# Patient Record
Sex: Male | Born: 1955 | Race: Black or African American | Hispanic: No | Marital: Married | Smoking: Current some day smoker
Health system: Southern US, Community
[De-identification: ages and names within clinical notes are randomized; demographics above are authoritative.]

## PROBLEM LIST (undated history)

## (undated) DIAGNOSIS — E785 Hyperlipidemia, unspecified: Secondary | ICD-10-CM

## (undated) DIAGNOSIS — J45909 Unspecified asthma, uncomplicated: Secondary | ICD-10-CM

## (undated) DIAGNOSIS — H409 Unspecified glaucoma: Secondary | ICD-10-CM

## (undated) DIAGNOSIS — M1990 Unspecified osteoarthritis, unspecified site: Secondary | ICD-10-CM

## (undated) DIAGNOSIS — R972 Elevated prostate specific antigen [PSA]: Secondary | ICD-10-CM

## (undated) DIAGNOSIS — I10 Essential (primary) hypertension: Secondary | ICD-10-CM

## (undated) HISTORY — DX: Elevated prostate specific antigen (PSA): R97.20

## (undated) HISTORY — DX: Unspecified osteoarthritis, unspecified site: M19.90

## (undated) HISTORY — DX: Unspecified glaucoma: H40.9

## (undated) HISTORY — DX: Unspecified asthma, uncomplicated: J45.909

## (undated) HISTORY — DX: Hyperlipidemia, unspecified: E78.5

## (undated) HISTORY — DX: Essential (primary) hypertension: I10

---

## 2009-07-31 ENCOUNTER — Ambulatory Visit: Payer: Self-pay | Admitting: Family Medicine

## 2013-07-02 ENCOUNTER — Ambulatory Visit: Payer: Self-pay | Admitting: Internal Medicine

## 2015-12-29 DIAGNOSIS — D649 Anemia, unspecified: Secondary | ICD-10-CM | POA: Insufficient documentation

## 2016-08-18 DIAGNOSIS — R972 Elevated prostate specific antigen [PSA]: Secondary | ICD-10-CM | POA: Insufficient documentation

## 2016-10-22 ENCOUNTER — Ambulatory Visit (INDEPENDENT_AMBULATORY_CARE_PROVIDER_SITE_OTHER): Payer: Self-pay | Admitting: Urology

## 2016-10-22 ENCOUNTER — Encounter: Payer: Self-pay | Admitting: Urology

## 2016-10-22 VITALS — BP 149/79 | HR 76 | Ht 70.0 in | Wt 223.0 lb

## 2016-10-22 DIAGNOSIS — Z8042 Family history of malignant neoplasm of prostate: Secondary | ICD-10-CM

## 2016-10-22 DIAGNOSIS — N138 Other obstructive and reflux uropathy: Secondary | ICD-10-CM

## 2016-10-22 DIAGNOSIS — R972 Elevated prostate specific antigen [PSA]: Secondary | ICD-10-CM

## 2016-10-22 DIAGNOSIS — N401 Enlarged prostate with lower urinary tract symptoms: Secondary | ICD-10-CM

## 2016-10-22 MED ORDER — TAMSULOSIN HCL 0.4 MG PO CAPS: 0.4000 mg | ORAL_CAPSULE | Freq: Every day | ORAL | 11 refills | Status: DC

## 2016-10-22 NOTE — Progress Notes (Signed)
10/22/2016 3:11 PM   Peter Whitaker 1955/07/06 [id number]  Referring provider: Mick SellFitzgerald, David P, MD [address] [place], [place] [address]  Chief Complaint  Patient presents with  . New Patient (Initial Visit)    Elevated PSA referred by Peter Whitaker    HPI: 61 year old male referred by his PCP for elevated PSA.    His PSA on 12/29/2015 which was 5.53.   This was repeated on 08/18/2016 found to be 5.3 with a free PSA of 0.51, 9.6%. This puts his risk of prostate cancer at 58%.  He denies a previous history of elevated PSA. He's never seen a urologist in the past.  + FH of prostate cancer, father dx in late 2270s but lived until 2390s with prostate cancer.  He was on lupron.   He denies any urine or symptoms at baseline. He feels that he is able to empty his bladder and has good urinary stream. No frequency or urgency. No nocturia. He does however note that when he takes cold medicine, he has difficulty emptying his bladder and urinating. He does have severe allergies and would like to be on a daily Claritin.  He has been told in the past that he has an enlarged prostate gland.   PMH: Past Medical History:  Diagnosis Date  . Arthritis   . Asthma   . Elevated PSA   . Glaucoma   . HLD (hyperlipidemia)   . HTN (hypertension)     Surgical History: History reviewed. No pertinent surgical history.  Home Medications:  Allergies as of 10/22/2016   No Known Allergies     Medication List       Accurate as of 10/22/16  3:11 PM. Always use your most recent med list.          amLODipine 10 MG tablet Commonly known as:  NORVASC Take by mouth.   aspirin EC 81 MG tablet Take by mouth.   hydrochlorothiazide 25 MG tablet Commonly known as:  HYDRODIURIL Take by mouth.   tamsulosin 0.4 MG Caps capsule Commonly known as:  FLOMAX Take 1 capsule (0.4 mg total) by mouth daily.       Allergies: No Known Allergies  Family History: Family History  Problem  Relation Age of Onset  . Prostate cancer Father   . Kidney cancer Neg Hx   . Bladder Cancer Neg Hx     Social History:  reports that he has been smoking Cigars.  His smokeless tobacco use includes Chew. He reports that he drinks alcohol. He reports that he does not use drugs.  ROS: UROLOGY Frequent Urination?: No Hard to postpone urination?: No Burning/pain with urination?: No Get up at night to urinate?: No Leakage of urine?: No Urine stream starts and stops?: Yes Trouble starting stream?: No Do you have to strain to urinate?: No Blood in urine?: No Urinary tract infection?: No Sexually transmitted disease?: No Injury to kidneys or bladder?: No Painful intercourse?: No Weak stream?: No Erection problems?: No Penile pain?: No  Gastrointestinal Nausea?: No Vomiting?: No Indigestion/heartburn?: No Diarrhea?: No Constipation?: No  Constitutional Fever: No Night sweats?: No Weight loss?: No Fatigue?: No  Skin Skin rash/lesions?: No Itching?: Yes  Eyes Blurred vision?: Yes Double vision?: No  Ears/Nose/Throat Sore throat?: Yes Sinus problems?: Yes  Hematologic/Lymphatic Swollen glands?: No Easy bruising?: No  Cardiovascular Leg swelling?: Yes Chest pain?: No  Respiratory Cough?: Yes Shortness of breath?: Yes  Endocrine Excessive thirst?: No  Musculoskeletal Back pain?: Yes Joint pain?:  Yes  Neurological Headaches?: No Dizziness?: Yes  Psychologic Depression?: No Anxiety?: No  Physical Exam: BP (!) 149/79   Pulse 76   Ht 5\' 10"  (1.778 m)   Wt 223 lb (101.2 kg)   BMI 32.00 kg/m   Constitutional:  Alert and oriented, No acute distress.  African-American male. HEENT: [place] AT, moist mucus membranes.  Trachea midline, no masses. Cardiovascular: No clubbing, cyanosis, or edema. Respiratory: Normal respiratory effort, no increased work of breathing. GI: Abdomen is soft, nontender, nondistended, no abdominal masses GU: No CVA tenderness.    Rectal: Increased sphincter tone. Due to habitus, exam was somewhat difficult but possible induration on the left lateral lobe of the gland.  Enlarged 50+ cc. Skin: No rashes, bruises or suspicious lesions. Neurologic: Grossly intact, no focal deficits, moving all 4 extremities. Psychiatric: Normal mood and affect.  Laboratory Data: Creatinine 1.08/2016 PSA as above  Pertinent Imaging: n/a  Assessment & Plan:  61 year old African-American male with elevated PSA of 5.3, elevated free PSA, and family history of prostate cancer. Rectal exam today was somewhat limited.  1. BPH with obstruction/lower urinary tract symptoms Patient does have an enlarged gland on rectal exam today Although he is asymptomatic at baseline, when he takes medications with anticholinergic properties, he has issues voiding, with a stream, and emptying As such, I prescribed him Flomax to be taken when he is taking cold medications He should return if his symptoms persist or fail to improve  2. Elevated PSA  We reviewed the implications of an elevated PSA and the uncertainty surrounding it. In general, a man's PSA increases with age and is produced by both normal and cancerous prostate tissue. Differential for elevated PSA is BPH, prostate cancer, infection, recent intercourse/ejaculation, prostate infarction, recent urethroscopic manipulation (foley placement/cystoscopy) and prostatitis. Management of an elevated PSA can include observation or prostate biopsy and wediscussed this in detail. We discussed that indications for prostate biopsy are defined by age and race specific PSA cutoffs as well as a PSA velocity of 0.75/year.  He does have multiple risk factors for prostate cancer. As such, I recommend a prostate biopsy.  We discussed prostate biopsy in detail including the procedure itself, the risks of blood in the urine, stool, and ejaculate, serious infection, and discomfort. He is willing to proceed with this as  discussed.  He will hold his aspirin for 1 week prior to the procedure. Instructions were reviewed again today with the nurses well.   3. Family history of prostate cancer Risk factor for prostate cancer  Schedule prostate biopsy  Vanna Scotland, MD  [facility] [address] [place], [place] [address] [phone]

## 2016-11-24 ENCOUNTER — Ambulatory Visit (INDEPENDENT_AMBULATORY_CARE_PROVIDER_SITE_OTHER): Payer: Self-pay | Admitting: Urology

## 2016-11-24 ENCOUNTER — Other Ambulatory Visit: Payer: Self-pay | Admitting: Urology

## 2016-11-24 ENCOUNTER — Encounter: Payer: Self-pay | Admitting: Urology

## 2016-11-24 VITALS — BP 126/69 | HR 93 | Ht 70.0 in | Wt 223.0 lb

## 2016-11-24 DIAGNOSIS — R972 Elevated prostate specific antigen [PSA]: Secondary | ICD-10-CM

## 2016-11-24 MED ORDER — GENTAMICIN SULFATE 40 MG/ML IJ SOLN
80.0000 mg | Freq: Once | INTRAMUSCULAR | Status: AC
  Administered 2016-11-24: 80 mg via INTRAMUSCULAR

## 2016-11-24 MED ORDER — LEVOFLOXACIN 500 MG PO TABS
500.0000 mg | ORAL_TABLET | Freq: Once | ORAL | Status: AC
  Administered 2016-11-24: 500 mg via ORAL

## 2016-11-24 NOTE — Progress Notes (Signed)
   11/24/16  CC:  Chief Complaint  Patient presents with  . Prostate Biopsy    HPI: 61 year old male referred by his PCP for elevated PSA who presents today for prostate biopsy.      His PSA on 12/29/2015 which was 5.53.   This was repeated on 08/18/2016 found to be 5.3 with a free PSA of 0.51, 9.6%. This puts his risk of prostate cancer at 58%.  He denies a previous history of elevated PSA. He's never seen a urologist in the past.  + FH of prostate cancer, father dx in late 41s but lived until 71s with prostate cancer.  He was on lupron.  Rectal same was somewhat difficult but possible induration the left lateral lobe  Blood pressure 126/69, pulse 93, height 5\' 10"  (1.778 m), weight 223 lb (101.2 kg). NED. A&Ox3.   No respiratory distress   Abd soft, NT, ND Normal phallus with bilateral descended testicles  Prostate Biopsy Procedure   Informed consent was obtained after discussing risks/benefits of the procedure.  A time out was performed to ensure correct patient identity.  Pre-Procedure: - Gentamicin given prophylactically - Levaquin 500 mg administered PO -Transrectal Ultrasound performed revealing a 51.7 gm prostate -No significant hypoechoic or median lobe noted  Procedure: - Prostate block performed using 10 cc 1% lidocaine and biopsies taken from sextant areas, a total of 12 under ultrasound guidance.  Post-Procedure: - Patient tolerated the procedure well - He was counseled to seek immediate medical attention if experiences any severe pain, significant bleeding, or fevers - Return in one week to discuss biopsy results  Assessment/ Plan:  1. Elevated PSA S/p uncomplicated biopsy Warning symptoms reviewed-particularly signs and symptoms of infection  Follow-up next week as scheduled to review pathology results  Vanna Scotland, MD

## 2016-11-30 ENCOUNTER — Telehealth: Payer: Self-pay

## 2016-11-30 ENCOUNTER — Other Ambulatory Visit: Payer: Self-pay | Admitting: Urology

## 2016-11-30 LAB — PATHOLOGY REPORT

## 2016-11-30 NOTE — Telephone Encounter (Signed)
LMOM

## 2016-11-30 NOTE — Telephone Encounter (Signed)
-----   Message from Vanna Scotland, MD sent at 11/30/2016 11:43 AM EDT ----- [facility], let this patient know his prostate biopsy was negative. I would like to follow him closely because his PSAs more elevated than it like. Please arrange for f/u in 6 months with a PSA just prior to our visit.  Vanna Scotland, MD

## 2016-12-01 NOTE — Telephone Encounter (Signed)
Patient advised of negative biopsy results.  I cancelled his follow up appointment for this week and scheduled a 6 month follow up on 3/1 in [place] with PSA lab the week before.

## 2016-12-03 ENCOUNTER — Ambulatory Visit: Payer: Self-pay | Admitting: Urology

## 2017-05-27 ENCOUNTER — Other Ambulatory Visit
Admission: RE | Admit: 2017-05-27 | Discharge: 2017-05-27 | Disposition: A | Discharge status: Home / Self Care | Payer: BLUE CROSS/BLUE SHIELD | Source: Ambulatory Visit | Attending: Urology | Admitting: Urology

## 2017-05-27 ENCOUNTER — Other Ambulatory Visit: Payer: Self-pay

## 2017-05-27 DIAGNOSIS — R972 Elevated prostate specific antigen [PSA]: Secondary | ICD-10-CM | POA: Diagnosis present

## 2017-05-27 LAB — PSA: Prostatic Specific Antigen: 5.64 ng/mL — ABNORMAL HIGH (ref 0.00–4.00)

## 2017-06-03 ENCOUNTER — Encounter: Payer: Self-pay | Admitting: Urology

## 2017-06-03 ENCOUNTER — Ambulatory Visit (INDEPENDENT_AMBULATORY_CARE_PROVIDER_SITE_OTHER): Payer: BLUE CROSS/BLUE SHIELD | Admitting: Urology

## 2017-06-03 VITALS — BP 156/71 | HR 84 | Ht 70.0 in | Wt 220.0 lb

## 2017-06-03 DIAGNOSIS — N401 Enlarged prostate with lower urinary tract symptoms: Secondary | ICD-10-CM | POA: Diagnosis not present

## 2017-06-03 DIAGNOSIS — R972 Elevated prostate specific antigen [PSA]: Secondary | ICD-10-CM

## 2017-06-03 DIAGNOSIS — N138 Other obstructive and reflux uropathy: Secondary | ICD-10-CM | POA: Diagnosis not present

## 2017-06-03 DIAGNOSIS — Z8042 Family history of malignant neoplasm of prostate: Secondary | ICD-10-CM | POA: Diagnosis not present

## 2017-06-03 NOTE — Progress Notes (Signed)
06/03/2017 12:11 PM   Peter Whitaker [birth year] [id number]  Referring provider: Mick Sell, MD [address] [place], [place] [address]  Chief Complaint  Patient presents with  . Benign Prostatic Hypertrophy    HPI: 62 year old male with a history of elevated PSA who returns today for routine follow-up.    He returns today for PSA following negative prostate biopsy 6 months ago (11/24/16).   TRUS volume 52 cc.  Rectal exam on 10/22/16 enlarged, 50+ cc with possible induration at the left lateral lobe but exam limited by habitus.  + FH of prostate cancer, father dx in late 25s but lived until 72s with prostate cancer.  He was on lupron.   He denies any urine or symptoms at baseline. He feels that he is able to empty his bladder and has good urinary stream. No frequency or urgency. No nocturia.  Stable on Flomax.  PSA trend: 5.53 12/29/2015 5.3  (free PSA 0.51) 08/18/2016  5.64 05/27/17  PMH: Past Medical History:  Diagnosis Date  . Arthritis   . Asthma   . Elevated PSA   . Glaucoma   . HLD (hyperlipidemia)   . HTN (hypertension)     Surgical History: History reviewed. No pertinent surgical history.  Home Medications:  Allergies as of 06/03/2017   No Known Allergies     Medication List        Accurate as of 06/03/17 11:59 PM. Always use your most recent med list.          amLODipine 10 MG tablet Commonly known as:  NORVASC Take by mouth.   hydrochlorothiazide 25 MG tablet Commonly known as:  HYDRODIURIL Take by mouth.   tamsulosin 0.4 MG Caps capsule Commonly known as:  FLOMAX Take 1 capsule (0.4 mg total) by mouth daily.       Allergies: No Known Allergies  Family History: Family History  Problem Relation Age of Onset  . Prostate cancer Father   . Kidney cancer Neg Hx   . Bladder Cancer Neg Hx     Social History:  reports that he has been smoking cigars.  His smokeless tobacco use includes chew. He reports that he drinks  alcohol. He reports that he does not use drugs.  ROS: UROLOGY Frequent Urination?: No Hard to postpone urination?: No Burning/pain with urination?: No Get up at night to urinate?: No Leakage of urine?: No Urine stream starts and stops?: No Trouble starting stream?: No Do you have to strain to urinate?: No Blood in urine?: No Urinary tract infection?: No Sexually transmitted disease?: No Injury to kidneys or bladder?: No Painful intercourse?: No Weak stream?: No Erection problems?: No Penile pain?: No  Gastrointestinal Nausea?: No Vomiting?: No Indigestion/heartburn?: No Diarrhea?: No Constipation?: No  Constitutional Fever: No Night sweats?: No Weight loss?: No Fatigue?: No  Skin Skin rash/lesions?: No Itching?: No  Eyes Blurred vision?: No Double vision?: No  Ears/Nose/Throat Sore throat?: No Sinus problems?: No  Hematologic/Lymphatic Swollen glands?: No Easy bruising?: No  Cardiovascular Leg swelling?: No Chest pain?: No  Respiratory Cough?: No Shortness of breath?: No  Endocrine Excessive thirst?: No  Musculoskeletal Back pain?: No Joint pain?: No  Neurological Headaches?: No Dizziness?: No  Psychologic Depression?: No Anxiety?: No  Physical Exam: BP (!) 156/71   Pulse 84   Ht 5\' 10"  (1.778 m)   Wt 220 lb (99.8 kg)   BMI 31.57 kg/m   Constitutional:  Alert and oriented, No acute distress.  African-American male. HEENT:  [place] AT, moist mucus membranes.  Trachea midline, no masses. Cardiovascular: No clubbing, cyanosis, or edema. Respiratory: Normal respiratory effort, no increased work of breathing. Skin: No rashes, bruises or suspicious lesions. Neurologic: Grossly intact, no focal deficits, moving all 4 extremities. Psychiatric: Normal mood and affect.  Laboratory Data: Creatinine 1.0 pn 03/08/2017 PSA as above  Pertinent Imaging: n/a  Assessment & Plan:   1. BPH with obstruction/lower urinary tract symptoms  Continue  flomax Symptoms stable  2. Elevated PSA Elevated PSA  s/p negative biopsy 6 months ago PSA mildly elevated but not significantly changed from previous values Will continue to follow    3. Family history of prostate cancer Risk factor for prostate cancer  Return in about 6 months (around 12/04/2017) for PSA/ DRE.  Vanna ScotlandAshley Aleighya Mcanelly, MD  [facility] [address] [place], [place] [address] [phone]

## 2017-12-09 ENCOUNTER — Other Ambulatory Visit
Admission: RE | Admit: 2017-12-09 | Discharge: 2017-12-09 | Disposition: A | Discharge status: Home / Self Care | Payer: BLUE CROSS/BLUE SHIELD | Source: Ambulatory Visit | Attending: Urology | Admitting: Urology

## 2017-12-09 ENCOUNTER — Ambulatory Visit (INDEPENDENT_AMBULATORY_CARE_PROVIDER_SITE_OTHER): Payer: BLUE CROSS/BLUE SHIELD | Admitting: Urology

## 2017-12-09 ENCOUNTER — Encounter: Payer: Self-pay | Admitting: Urology

## 2017-12-09 ENCOUNTER — Other Ambulatory Visit: Payer: Self-pay | Admitting: Family Medicine

## 2017-12-09 VITALS — BP 153/74 | HR 77 | Ht 70.0 in | Wt 215.0 lb

## 2017-12-09 DIAGNOSIS — N138 Other obstructive and reflux uropathy: Secondary | ICD-10-CM | POA: Diagnosis not present

## 2017-12-09 DIAGNOSIS — R972 Elevated prostate specific antigen [PSA]: Secondary | ICD-10-CM | POA: Diagnosis present

## 2017-12-09 DIAGNOSIS — N401 Enlarged prostate with lower urinary tract symptoms: Secondary | ICD-10-CM

## 2017-12-09 LAB — PSA: PROSTATIC SPECIFIC ANTIGEN: 6.04 ng/mL — AB (ref 0.00–4.00)

## 2017-12-09 NOTE — Progress Notes (Signed)
12/09/2017 2:15 PM   Peter Whitaker [birth year] [id number]  Referring provider: Mick Sell, MD [address] [facility] [place], [place] [address]  Chief Complaint  Patient presents with  . Elevated PSA    HPI: 62 year old male who returns today.  He has a personal history of elevated PSA.  He was last seen in 06/2017 returns for six-month follow-up.  He returns today for PSA following negative prostate biopsy one year ago (11/24/16).   TRUS volume 52 cc.  Rectal exam on 10/22/16 enlarged, 50+ cc with possible induration at the left lateral lobe but exam limited by habitus.  + FH of prostate cancer, father dx in late 37s but lived until 70s with prostate cancer.  He was on lupron.   He denies any urine or symptoms at baseline. He feels that he is able to empty his bladder and has good urinary stream. No frequency or urgency. No nocturia.  Stable on Flomax.  This is unchanged from last visit.  PSA trend: 5.53 12/29/2015 5.3  (free PSA 0.51) 08/18/2016  5.64 05/27/17 PSA today pending  PMH: Past Medical History:  Diagnosis Date  . Arthritis   . Asthma   . Elevated PSA   . Glaucoma   . HLD (hyperlipidemia)   . HTN (hypertension)     Surgical History: History reviewed. No pertinent surgical history.  Home Medications:  Allergies as of 12/09/2017   No Known Allergies     Medication List        Accurate as of 12/09/17  2:15 PM. Always use your most recent med list.          amLODipine 10 MG tablet Commonly known as:  NORVASC Take by mouth.   hydrochlorothiazide 25 MG tablet Commonly known as:  HYDRODIURIL Take by mouth.   potassium chloride SA 20 MEQ tablet Commonly known as:  K-DUR,KLOR-CON Take by mouth.   tamsulosin 0.4 MG Caps capsule Commonly known as:  FLOMAX Take 1 capsule (0.4 mg total) by mouth daily.       Allergies: No Known Allergies  Family History: Family History  Problem Relation Age of  Onset  . Prostate cancer Father   . Kidney cancer Neg Hx   . Bladder Cancer Neg Hx     Social History:  reports that he has been smoking cigars. His smokeless tobacco use includes chew. He reports that he drinks alcohol. He reports that he does not use drugs.  ROS: UROLOGY Frequent Urination?: No Hard to postpone urination?: No Burning/pain with urination?: No Get up at night to urinate?: No Leakage of urine?: No Urine stream starts and stops?: No Trouble starting stream?: No Do you have to strain to urinate?: No Blood in urine?: No Urinary tract infection?: No Sexually transmitted disease?: No Injury to kidneys or bladder?: No Painful intercourse?: No Weak stream?: No Erection problems?: No Penile pain?: No  Gastrointestinal Nausea?: No Vomiting?: No Indigestion/heartburn?: No Diarrhea?: No Constipation?: No  Constitutional Fever: No Night sweats?: No Weight loss?: No Fatigue?: No  Skin Skin rash/lesions?: No Itching?: Yes  Eyes Blurred vision?: Yes Double vision?: No  Ears/Nose/Throat Sore throat?: Yes Sinus problems?: Yes  Hematologic/Lymphatic Swollen glands?: No Easy bruising?: No  Cardiovascular Leg swelling?: No Chest pain?: No  Respiratory Cough?: No Shortness of breath?: No  Endocrine Excessive thirst?: No  Musculoskeletal Back pain?: Yes Joint pain?: No  Neurological Headaches?: No Dizziness?: No  Psychologic Depression?: No Anxiety?: No  Physical Exam:  BP (!) 153/74 (BP Location: Left Arm, Patient Position: Sitting, Cuff Size: Normal)   Pulse 77   Ht 5\' 10"  (1.778 m)   Wt 215 lb (97.5 kg)   BMI 30.85 kg/m   Constitutional:  Alert and oriented, No acute distress. HEENT: [place] AT, moist mucus membranes.  Trachea midline, no masses. Cardiovascular: No clubbing, cyanosis, or edema. Respiratory: Normal respiratory effort, no increased work of breathing. GU: No CVA tenderness Sphincter tone: Normal sphincter tone.  50+ cc  prostate, somewhat limited due to habitus, no obvious palpable nodules. Skin: No rashes, bruises or suspicious lesions. Neurologic: Grossly intact, no focal deficits, moving all 4 extremities. Psychiatric: Normal mood and affect.  Laboratory Data: PSA trend as above  Urinalysis N/a  Pertinent Imaging: n/a  Assessment & Plan:    1. Elevated PSA Personal history of relatively stably elevated PSA PSA today is pending Depending on his repeat labs today, we will either recommend continued six-month follow-up, increasing follow-up to q. 12 months versus prostate MRI if PSA continues to rise He is agreeable with this plan we will give him a call on Monday with the results and recommendations He was advised in the future, he should have his PSA drawn the day prior to the visit if possible  2. BPH with obstruction/lower urinary tract symptoms Doing well on Flomax, continue this medication   Return in about 6 months (around 06/09/2018) for PSA (prior) .  Vanna Scotland, MD  [facility] [address] [place], [place] [address] [phone]

## 2018-03-27 ENCOUNTER — Telehealth: Payer: Self-pay

## 2018-03-27 NOTE — Telephone Encounter (Signed)
-----   Message from Vanna ScotlandAshley Brandon, MD sent at 03/24/2018  3:31 PM EST ----- This is an old result from 3 months ago still in my inbasket.  I wanted to be sure pt was aware that is PSA continues to trend up and we will plan to recheck in march as previously scheduled.  If still up or rising in march, will pursue MRI.  Basically no change in current plan -- will continue to follow closely for now and reassess.    Vanna ScotlandAshley Brandon, MD

## 2018-03-27 NOTE — Telephone Encounter (Signed)
Patient called back agreed with plan      Peter Whitaker

## 2018-03-27 NOTE — Telephone Encounter (Signed)
Left pt mess to call 

## 2018-06-09 ENCOUNTER — Telehealth: Payer: Self-pay | Admitting: Urology

## 2018-06-09 ENCOUNTER — Ambulatory Visit: Payer: BLUE CROSS/BLUE SHIELD | Admitting: Urology

## 2018-06-09 ENCOUNTER — Encounter: Payer: Self-pay | Admitting: Urology

## 2018-06-09 NOTE — Progress Notes (Deleted)
06/09/18   This patient was a no-show today.  It is very important that he follow-up with PSA.  Please call him to encourage to reschedule.   Parth Mccormac, MD 

## 2018-06-09 NOTE — Telephone Encounter (Signed)
Tried to contact pt and phone went dead, no v/m.

## 2018-06-09 NOTE — Telephone Encounter (Signed)
Please mail a letter to the patient indicating that follow-up in the near future with PSA is imperative.   Vanna Scotland, MD

## 2018-06-09 NOTE — Telephone Encounter (Signed)
06/09/18   This patient was a no-show today.  It is very important that he follow-up with PSA.  Please call him to encourage to reschedule.   Vanna Scotland, MD

## 2018-06-12 ENCOUNTER — Encounter: Payer: Self-pay | Admitting: Urology

## 2018-06-12 ENCOUNTER — Telehealth: Payer: Self-pay | Admitting: Urology

## 2018-06-12 NOTE — Telephone Encounter (Signed)
Can you see if he is willing to get PSA blood work at minimum in the interim?  Vanna Scotland, MD

## 2018-06-12 NOTE — Telephone Encounter (Signed)
Left message on pt's home answering machine and mailed letter.

## 2018-06-12 NOTE — Telephone Encounter (Signed)
Pt returned call and I rescheduled his appt for 8/14 in [place] due to his current insurance not being covered by [facility].  He will qualify for Medicare in July and wanted to schedule appt in August.  He will come in for PSA on 8/13 in [place].

## 2018-11-15 ENCOUNTER — Other Ambulatory Visit
Admission: RE | Admit: 2018-11-15 | Discharge: 2018-11-15 | Disposition: A | Discharge status: Home / Self Care | Payer: Medicare Other | Attending: Urology | Admitting: Urology

## 2018-11-15 ENCOUNTER — Other Ambulatory Visit: Payer: Self-pay

## 2018-11-15 DIAGNOSIS — R972 Elevated prostate specific antigen [PSA]: Secondary | ICD-10-CM

## 2018-11-15 LAB — PSA: Prostatic Specific Antigen: 5.36 ng/mL — ABNORMAL HIGH (ref 0.00–4.00)

## 2018-11-17 ENCOUNTER — Other Ambulatory Visit: Payer: Self-pay

## 2018-11-17 ENCOUNTER — Ambulatory Visit (INDEPENDENT_AMBULATORY_CARE_PROVIDER_SITE_OTHER): Payer: Medicare Other | Admitting: Urology

## 2018-11-17 ENCOUNTER — Ambulatory Visit: Payer: BLUE CROSS/BLUE SHIELD | Admitting: Urology

## 2018-11-17 ENCOUNTER — Encounter: Payer: Self-pay | Admitting: Urology

## 2018-11-17 VITALS — BP 148/71 | HR 83 | Ht 70.0 in | Wt 215.0 lb

## 2018-11-17 DIAGNOSIS — E785 Hyperlipidemia, unspecified: Secondary | ICD-10-CM | POA: Insufficient documentation

## 2018-11-17 DIAGNOSIS — I10 Essential (primary) hypertension: Secondary | ICD-10-CM | POA: Insufficient documentation

## 2018-11-17 DIAGNOSIS — M519 Unspecified thoracic, thoracolumbar and lumbosacral intervertebral disc disorder: Secondary | ICD-10-CM | POA: Insufficient documentation

## 2018-11-17 DIAGNOSIS — N400 Enlarged prostate without lower urinary tract symptoms: Secondary | ICD-10-CM | POA: Insufficient documentation

## 2018-11-17 DIAGNOSIS — N401 Enlarged prostate with lower urinary tract symptoms: Secondary | ICD-10-CM

## 2018-11-17 DIAGNOSIS — N138 Other obstructive and reflux uropathy: Secondary | ICD-10-CM

## 2018-11-17 DIAGNOSIS — R972 Elevated prostate specific antigen [PSA]: Secondary | ICD-10-CM

## 2018-11-17 NOTE — Progress Notes (Signed)
11/17/2018 3:01 PM   Peter Whitaker [birth year] [id number]  Referring provider: Gracelyn NurseJohnston, John D, MD [address] [place],  [place] [id number]  Chief Complaint  Patient presents with   Elevated PSA    48month    HPI: 63 year old male who returns today.  He has a personal history of elevated PSA.  He was last seen 6 months ago.  He returns today for PSA following negative prostate biopsy one year ago(11/24/16).   TRUS volume 52 cc.  Rectal exam on 10/22/16 enlarged, 50+ cc with possible induration at the left lateral lobe but exam limited by habitus.  + FH of prostate cancer, father dx in late 3170s but lived until 4490s with prostate cancer. He was on lupron.  He denies any urine or symptoms at baseline. He feels that he is able to empty his bladder and has good urinary stream. No frequency or urgency. No nocturia.Stable on Flomax.  This is unchanged from last visit.  He feels like he is doing well.    PSA trend: 5.53 12/29/2015 5.3(free PSA 0.51)08/18/2016 5.64 05/27/17 6.04  12/09/2017 5.36  11/15/2018  PMH: Past Medical History:  Diagnosis Date   Arthritis    Asthma    Elevated PSA    Glaucoma    HLD (hyperlipidemia)    HTN (hypertension)     Surgical History: No past surgical history on file.  Home Medications:  Allergies as of 11/17/2018   No Known Allergies     Medication List       Accurate as of November 17, 2018  3:01 PM. If you have any questions, ask your nurse or doctor.        amLODipine 10 MG tablet Commonly known as: NORVASC Take by mouth.   hydrochlorothiazide 25 MG tablet Commonly known as: HYDRODIURIL Take by mouth.   potassium chloride SA 20 MEQ tablet Commonly known as: K-DUR Take by mouth.   tamsulosin 0.4 MG Caps capsule Commonly known as: Flomax Take 1 capsule (0.4 mg total) by mouth daily.       Allergies: No Known Allergies  Family History: Family History  Problem Relation Age of Onset    Prostate cancer Father    Kidney cancer Neg Hx    Bladder Cancer Neg Hx     Social History:  reports that he has been smoking cigars. His smokeless tobacco use includes chew. He reports current alcohol use. He reports that he does not use drugs.  ROS: UROLOGY Frequent Urination?: No Hard to postpone urination?: No Burning/pain with urination?: No Get up at night to urinate?: No Leakage of urine?: No Urine stream starts and stops?: No Trouble starting stream?: No Do you have to strain to urinate?: No Blood in urine?: No Urinary tract infection?: No Sexually transmitted disease?: No Injury to kidneys or bladder?: No Painful intercourse?: No Weak stream?: No Erection problems?: No Penile pain?: No  Gastrointestinal Nausea?: No Vomiting?: No Indigestion/heartburn?: No Diarrhea?: No Constipation?: No  Constitutional Fever: No Night sweats?: No Weight loss?: No Fatigue?: No  Skin Skin rash/lesions?: No Itching?: No  Eyes Blurred vision?: No Double vision?: No  Ears/Nose/Throat Sore throat?: No Sinus problems?: No  Hematologic/Lymphatic Swollen glands?: No Easy bruising?: No  Cardiovascular Leg swelling?: No Chest pain?: No  Respiratory Cough?: No Shortness of breath?: No  Endocrine Excessive thirst?: No  Musculoskeletal Back pain?: No Joint pain?: No  Neurological Headaches?: No Dizziness?: No  Psychologic Depression?: No Anxiety?: No  Physical Exam: BP (!) 148/71  Pulse 83    Ht 5\' 10"  (1.778 m)    Wt 215 lb (97.5 kg)    BMI 30.85 kg/m   Constitutional:  Alert and oriented, No acute distress. HEENT: [place] AT, moist mucus membranes.  Trachea midline, no masses. Cardiovascular: No clubbing, cyanosis, or edema. Respiratory: Normal respiratory effort, no increased work of breathing. Rectal: Normal sphincter tone.  50 cc prostate, nontender, no nodules, prominent lateral ridges without significant induration or nodules. Skin: No rashes,  bruises or suspicious lesions. Neurologic: Grossly intact, no focal deficits, moving all 4 extremities. Psychiatric: Normal mood and affect.   Assessment & Plan:     No follow-ups on file.  Hollice Espy, MD  [facility] [address] [place], [place] [address] [phone]

## 2018-12-01 ENCOUNTER — Ambulatory Visit: Payer: BLUE CROSS/BLUE SHIELD | Admitting: Urology

## 2019-08-23 ENCOUNTER — Ambulatory Visit
Admission: EM | Admit: 2019-08-23 | Discharge: 2019-08-23 | Disposition: A | Discharge status: Home / Self Care | Payer: Medicare Other | Attending: Family Medicine | Admitting: Family Medicine

## 2019-08-23 ENCOUNTER — Other Ambulatory Visit: Payer: Self-pay

## 2019-08-23 DIAGNOSIS — R0982 Postnasal drip: Secondary | ICD-10-CM | POA: Insufficient documentation

## 2019-08-23 LAB — GROUP A STREP BY PCR: Group A Strep by PCR: NOT DETECTED

## 2019-08-23 MED ORDER — CETIRIZINE HCL 10 MG PO TABS: 10.0000 mg | ORAL_TABLET | Freq: Every day | ORAL | 1 refills | Status: DC

## 2019-08-23 MED ORDER — IPRATROPIUM BROMIDE 0.06 % NA SOLN: 2.0000 | Freq: Four times a day (QID) | NASAL | 0 refills | Status: DC | PRN

## 2019-08-23 NOTE — Discharge Instructions (Addendum)
This is allergic in nature.  Medications as prescribed.  Take care  Dr. Adriana Simas

## 2019-08-23 NOTE — ED Triage Notes (Addendum)
Sore throat and nasal drainage since Sunday, no fevers at home. Pt states started after getting a haircut. Offered COVID test, patient declined until speaking with provider

## 2019-08-23 NOTE — ED Provider Notes (Signed)
[facility]    CSN: [id number] Arrival date & time: 08/23/19  0093      History   Chief Complaint Chief Complaint  Patient presents with  . Sore Throat   HPI  64 year old male presents with the above complaint.  Patient states that he has had a sore throat and postnasal drip since Sunday.  No fever.  He states that he otherwise feels well.  Declines Covid testing.  No reported sick contacts.  No relieving factors.  States that he has had some pain with swallowing.  No other respiratory symptoms.  No other complaints or concerns at this time.  Past Medical History:  Diagnosis Date  . Arthritis   . Asthma   . Elevated PSA   . Glaucoma   . HLD (hyperlipidemia)   . HTN (hypertension)     Patient Active Problem List   Diagnosis Date Noted  . Benign prostatic hyperplasia 11/17/2018  . Hyperlipidemia 11/17/2018  . Hypertension 11/17/2018  . Lumbar disc disease 11/17/2018  . Elevated PSA 08/18/2016  . Anemia 12/29/2015    Home Medications    Prior to Admission medications   Medication Sig Start Date End Date Taking? Authorizing Provider  fluticasone (FLONASE) 50 MCG/ACT nasal spray Place into both nostrils daily.   Yes [provider]  pseudoephedrine (SUDAFED) 30 MG tablet Take 30 mg by mouth every 4 (four) hours as needed for congestion.   Yes [provider]  amLODipine (NORVASC) 10 MG tablet Take by mouth. 12/11/15   [provider]  cetirizine (ZYRTEC ALLERGY) 10 MG tablet Take 1 tablet (10 mg total) by mouth daily. 08/23/19   Coral Spikes, DO  hydrochlorothiazide (HYDRODIURIL) 25 MG tablet Take by mouth. 12/29/15   [provider]  ipratropium (ATROVENT) 0.06 % nasal spray Place 2 sprays into both nostrils 4 (four) times daily as needed for rhinitis. 08/23/19   Coral Spikes, DO  potassium chloride SA (K-DUR,KLOR-CON) 20 MEQ tablet Take by mouth. 09/07/17 09/07/18  [provider]  tamsulosin (FLOMAX) 0.4 MG CAPS  capsule Take 1 capsule (0.4 mg total) by mouth daily. 10/22/16   Hollice Espy, MD    Family History Family History  Problem Relation Age of Onset  . Prostate cancer Father   . Kidney cancer Neg Hx   . Bladder Cancer Neg Hx     Social History Social History   Tobacco Use  . Smoking status: Current Some Day Smoker    Types: Cigars  . Smokeless tobacco: Current User    Types: Chew  Substance Use Topics  . Alcohol use: Yes  . Drug use: No     Allergies   Patient has no known allergies.   Review of Systems Review of Systems  Constitutional: Negative for fever.  HENT: Positive for postnasal drip and sore throat.    Physical Exam Triage Vital Signs ED Triage Vitals  Enc Vitals Group     BP 08/23/19 0838 (!) 158/78     Pulse Rate 08/23/19 0838 75     Resp 08/23/19 0838 16     Temp 08/23/19 0838 98.2 F (36.8 C)     Temp src --      SpO2 08/23/19 0838 100 %     Weight --      Height --      Head Circumference --      Peak Flow --      Pain Score 08/23/19 0839 0     Pain  Loc --      Pain Edu? --      Excl. in [place]? --    Updated Vital Signs BP (!) 158/78   Pulse 75   Temp 98.2 F (36.8 C)   Resp 16   SpO2 100%   Visual Acuity Right Eye Distance:   Left Eye Distance:   Bilateral Distance:    Right Eye Near:   Left Eye Near:    Bilateral Near:     Physical Exam Vitals and nursing note reviewed.  Constitutional:      General: He is not in acute distress.    Appearance: Normal appearance. He is not ill-appearing.  HENT:     Head: Normocephalic and atraumatic.     Mouth/Throat:     Pharynx: No oropharyngeal exudate.  Eyes:     General:        Right eye: No discharge.        Left eye: No discharge.     Conjunctiva/sclera: Conjunctivae normal.  Cardiovascular:     Rate and Rhythm: Normal rate and regular rhythm.  Pulmonary:     Effort: Pulmonary effort is normal.     Breath sounds: Normal breath sounds. No wheezing, rhonchi or rales.    Neurological:     Mental Status: He is alert.  Psychiatric:        Mood and Affect: Mood normal.        Behavior: Behavior normal.    UC Treatments / Results  Labs (all labs ordered are listed, but only abnormal results are displayed) Labs Reviewed  GROUP A STREP BY PCR    EKG   Radiology No results found.  Procedures Procedures (including critical care time)  Medications Ordered in UC Medications - No data to display  Initial Impression / Assessment and Plan / UC Course  I have reviewed the triage vital signs and the nursing notes.  Pertinent labs & imaging results that were available during my care of the patient were reviewed by me and considered in my medical decision making (see chart for details).    64 year old year old male presents with sore throat and post-nasal drip. Treating with Atrovent and Zyrtec. Strep negative.   Final Clinical Impressions(s) / UC Diagnoses   Final diagnoses:  Post-nasal drip     Discharge Instructions     This is allergic in nature.  Medications as prescribed.  Take care  Dr. Adriana Simas    ED Prescriptions    Medication Sig Dispense Auth. Provider   cetirizine (ZYRTEC ALLERGY) 10 MG tablet Take 1 tablet (10 mg total) by mouth daily. 30 tablet Veron Senner G, DO   ipratropium (ATROVENT) 0.06 % nasal spray Place 2 sprays into both nostrils 4 (four) times daily as needed for rhinitis. 15 mL Tommie Sams, DO     PDMP not reviewed this encounter.   Tommie Sams, [place] 08/23/19 1141

## 2019-11-19 ENCOUNTER — Other Ambulatory Visit
Admission: RE | Admit: 2019-11-19 | Discharge: 2019-11-19 | Disposition: A | Discharge status: Home / Self Care | Payer: Medicare Other | Attending: Urology | Admitting: Urology

## 2019-11-19 ENCOUNTER — Other Ambulatory Visit: Payer: Self-pay

## 2019-11-19 DIAGNOSIS — R972 Elevated prostate specific antigen [PSA]: Secondary | ICD-10-CM | POA: Insufficient documentation

## 2019-11-19 LAB — PSA: Prostatic Specific Antigen: 5.15 ng/mL — ABNORMAL HIGH (ref 0.00–4.00)

## 2019-11-22 NOTE — Progress Notes (Signed)
11/23/2019  3:59 PM   Peter Whitaker 10-05-1955 [id number]  Referring provider: Gracelyn Nurse, MD [address] [place],  [place] [address] Chief Complaint  Patient presents with  . Follow-up    PSA    HPI: Peter Whitaker is a 64 y.o. male presents for a 1 year follow up with a history of elevated PSA.   Prostate biopsy was negative on 11/24/2016.   TRUS volume 52 cc.  Rectal exam on 10/22/16 enlarged, 50+ cc with possible induration at the left lateral lobe but exam limited by habitus.  + FH of prostate cancer, father dx in late 84s but lived until 29s with prostate cancer. He was on lupron.  Most recent PSA was 5.15 as of 11/19/2019.   Today he is doing well. He is fully COVID-19 vaccinated.   Denies hematuria, nocturia, or any urinary symptoms. He reports mid section back pain.   PSA trend: 5.53 12/29/2015 5.3(free PSA 0.51)08/18/2016 5.64 05/27/17 6.04  12/09/2017 5.36  11/15/2018 5.15  11/19/2019   PMH: Past Medical History:  Diagnosis Date  . Arthritis   . Asthma   . Elevated PSA   . Glaucoma   . HLD (hyperlipidemia)   . HTN (hypertension)     Surgical History: No past surgical history on file.  Home Medications:  Allergies as of 11/23/2019   No Known Allergies     Medication List       Accurate as of November 23, 2019 11:59 PM. If you have any questions, ask your nurse or doctor.        amLODipine 10 MG tablet Commonly known as: NORVASC Take by mouth.   cetirizine 10 MG tablet Commonly known as: ZyrTEC Allergy Take 1 tablet (10 mg total) by mouth daily.   fluticasone 50 MCG/ACT nasal spray Commonly known as: FLONASE Place into both nostrils daily.   hydrochlorothiazide 25 MG tablet Commonly known as: HYDRODIURIL Take by mouth.   ipratropium 0.06 % nasal spray Commonly known as: ATROVENT Place 2 sprays into both nostrils 4 (four) times daily as needed for rhinitis.   potassium chloride SA 20 MEQ tablet Commonly  known as: KLOR-CON Take by mouth.   pseudoephedrine 30 MG tablet Commonly known as: SUDAFED Take 30 mg by mouth every 4 (four) hours as needed for congestion.   tamsulosin 0.4 MG Caps capsule Commonly known as: Flomax Take 1 capsule (0.4 mg total) by mouth daily.       Allergies: No Known Allergies  Family History: Family History  Problem Relation Age of Onset  . Prostate cancer Father   . Kidney cancer Neg Hx   . Bladder Cancer Neg Hx     Social History:  reports that he has been smoking cigars. His smokeless tobacco use includes chew. He reports current alcohol use. He reports that he does not use drugs.   Physical Exam: BP (!) 163/79   Pulse 91   Ht 5\' 10"  (1.778 m)   Wt 215 lb (97.5 kg)   BMI 30.85 kg/m   Constitutional:  Alert and oriented, No acute distress. HEENT: [place] AT, moist mucus membranes.  Trachea midline, no masses. Cardiovascular: No clubbing, cyanosis, or edema. Respiratory: Normal respiratory effort, no increased work of breathing. Rectal: Normal sphincter tone, prostatomegaly, no nodules/tenderness Skin: No rashes, bruises or suspicious lesions. Neurologic: Grossly intact, no focal deficits, moving all 4 extremities. Psychiatric: Normal mood and affect.  Assessment & Plan:    1. History of elevated PSA PSA  is stable and reassuring. DRE is normal Will continue annual follow up.    Follow up in 1 year with PSA and DRE.  [facility] [address] [place], [place] [address] [phone]  I, Peter Whitaker, am acting as a scribe for Dr. Vanna Scotland.  I have reviewed the above documentation for accuracy and completeness, and I agree with the above.   Vanna Scotland, MD

## 2019-11-23 ENCOUNTER — Ambulatory Visit: Payer: Medicare Other | Admitting: Urology

## 2019-11-23 ENCOUNTER — Encounter: Payer: Self-pay | Admitting: Urology

## 2019-11-23 ENCOUNTER — Other Ambulatory Visit: Payer: Self-pay

## 2019-11-23 VITALS — BP 163/79 | HR 91 | Ht 70.0 in | Wt 215.0 lb

## 2019-11-23 DIAGNOSIS — R972 Elevated prostate specific antigen [PSA]: Secondary | ICD-10-CM | POA: Diagnosis not present

## 2020-10-01 LAB — COLOGUARD: COLOGUARD: NEGATIVE

## 2020-11-12 ENCOUNTER — Other Ambulatory Visit: Payer: Self-pay

## 2020-11-12 ENCOUNTER — Other Ambulatory Visit
Admission: RE | Admit: 2020-11-12 | Discharge: 2020-11-12 | Disposition: A | Discharge status: Home / Self Care | Payer: Medicare Other | Attending: Urology | Admitting: Urology

## 2020-11-12 DIAGNOSIS — R972 Elevated prostate specific antigen [PSA]: Secondary | ICD-10-CM

## 2020-11-12 LAB — PSA: Prostatic Specific Antigen: 6.8 ng/mL — ABNORMAL HIGH (ref 0.00–4.00)

## 2020-11-21 ENCOUNTER — Other Ambulatory Visit: Payer: Self-pay

## 2020-11-21 ENCOUNTER — Ambulatory Visit (INDEPENDENT_AMBULATORY_CARE_PROVIDER_SITE_OTHER): Payer: Medicare Other | Admitting: Urology

## 2020-11-21 ENCOUNTER — Encounter: Payer: Self-pay | Admitting: Urology

## 2020-11-21 VITALS — BP 166/65 | HR 90 | Ht 70.0 in | Wt 212.0 lb

## 2020-11-21 DIAGNOSIS — R972 Elevated prostate specific antigen [PSA]: Secondary | ICD-10-CM | POA: Diagnosis not present

## 2020-11-21 NOTE — Progress Notes (Signed)
11/21/2020 9:14 AM   Maurine Minister Braman 1955/10/07 [id number]  Referring provider: Mick Sell, MD [address] [place],  [place] [address]  Chief Complaint  Patient presents with   Elevated PSA    1 year follow up    HPI: 65 year old male with a personal history of elevated PSA and BPH who returns today for routine annual follow-up.  No urinary complaints today.  No changes in medial history.    + FH of prostate cancer, father dx in late 89s but lived until 55s with prostate cancer.  He was on lupron.  PSA trend: 5.53 12/29/2015 5.3  (free PSA 0.51) 08/18/2016   --> prostate biopsy negative/ TRUS vol 52 5.64 05/27/17 6.04  12/09/2017 5.36  11/15/2018 5.15  11/19/2019  6.80  11/12/2020   PMH: Past Medical History:  Diagnosis Date   Arthritis    Asthma    Elevated PSA    Glaucoma    HLD (hyperlipidemia)    HTN (hypertension)     Surgical History: No past surgical history on file.  Home Medications:  Allergies as of 11/21/2020   No Known Allergies      Medication List        Accurate as of November 21, 2020  9:14 AM. If you have any questions, ask your nurse or doctor.          STOP taking these medications    fluticasone 50 MCG/ACT nasal spray Commonly known as: FLONASE Stopped by: Vanna Scotland, MD   tamsulosin 0.4 MG Caps capsule Commonly known as: Flomax Stopped by: Vanna Scotland, MD       TAKE these medications    amLODipine 10 MG tablet Commonly known as: NORVASC Take by mouth.   atorvastatin 10 MG tablet Commonly known as: LIPITOR Take 10 mg by mouth daily.   cetirizine 10 MG tablet Commonly known as: ZyrTEC Allergy Take 1 tablet (10 mg total) by mouth daily.   hydrochlorothiazide 25 MG tablet Commonly known as: HYDRODIURIL Take by mouth.   ipratropium 0.06 % nasal spray Commonly known as: ATROVENT Place 2 sprays into both nostrils 4 (four) times daily as needed for rhinitis.   potassium chloride SA 20 MEQ  tablet Commonly known as: KLOR-CON Take by mouth.   pseudoephedrine 30 MG tablet Commonly known as: SUDAFED Take 30 mg by mouth every 4 (four) hours as needed for congestion.        Allergies: No Known Allergies  Family History: Family History  Problem Relation Age of Onset   Prostate cancer Father    Kidney cancer Neg Hx    Bladder Cancer Neg Hx     Social History:  reports that he has been smoking cigars. His smokeless tobacco use includes chew. He reports current alcohol use. He reports that he does not use drugs.   Physical Exam: BP (!) 166/65   Pulse 90   Ht 5\' 10"  (1.778 m)   Wt 212 lb (96.2 kg)   BMI 30.42 kg/m   Constitutional:  Alert and oriented, No acute distress. HEENT: [place] AT, moist mucus membranes.  Trachea midline, no masses. Cardiovascular: No clubbing, cyanosis, or edema. Rectal: 50+ cc prostate, nontender, slight induration at left mid gland Respiratory: Normal respiratory effort, no increased work of breathing. Skin: No rashes, bruises or suspicious lesions. Neurologic: Grossly intact, no focal deficits, moving all 4 extremities. Psychiatric: Normal mood and affect.   Assessment & Plan:    1. Elevated PSA Rising level but close  to previous peak  Rectal exam today mildly suspicious  Discussed MRI vs. Close observation, will opt for close f/u with PSA again in 6 months  Will peruse repeat bx vs. MRI if continues to rise - PSA; Future   Return in about 6 months (around 05/24/2021) for 31mo w/PSA prior.  Vanna Scotland, MD  [facility] [address] [place], [place] [address] [phone]

## 2021-02-12 ENCOUNTER — Ambulatory Visit
Admission: EM | Admit: 2021-02-12 | Discharge: 2021-02-12 | Disposition: A | Discharge status: Home / Self Care | Payer: Medicare Other | Attending: Emergency Medicine | Admitting: Emergency Medicine

## 2021-02-12 ENCOUNTER — Encounter: Payer: Self-pay | Admitting: Emergency Medicine

## 2021-02-12 ENCOUNTER — Other Ambulatory Visit: Payer: Self-pay

## 2021-02-12 DIAGNOSIS — J069 Acute upper respiratory infection, unspecified: Secondary | ICD-10-CM

## 2021-02-12 MED ORDER — IPRATROPIUM BROMIDE 0.06 % NA SOLN: 2.0000 | Freq: Four times a day (QID) | NASAL | 0 refills | Status: DC

## 2021-02-12 MED ORDER — AMOXICILLIN-POT CLAVULANATE 875-125 MG PO TABS: 1.0000 | ORAL_TABLET | Freq: Two times a day (BID) | ORAL | 0 refills | Status: AC

## 2021-02-12 NOTE — ED Triage Notes (Signed)
Pt presents today with c/o continued nasal congestion/runny nose and chest congestion for 6 weeks, denies fever.

## 2021-02-12 NOTE — ED Provider Notes (Signed)
[facility]    CSN: [id number] Arrival date & time: 02/12/21  6712      History   Chief Complaint Chief Complaint  Patient presents with   Nasal Congestion   chest congestion    HPI Peter Whitaker is a 65 y.o. male.   HPI  65 year old male here for evaluation of respiratory symptoms.  Patient reports that he has been experiencing nasal congestion with runny nose and chest congestion for at least 6 weeks.  Patient reports that he was evaluated here at some point in the past and started on Zyrtec and Atrovent nasal spray.  He then saw his primary care doctor in June of this year who renewed the Atrovent nasal spray but told him to take it once daily.  He states that he uses it maybe a couple times a week.  He is also supposed be taking Zyrtec which she says he has been taking.  Patient reports that his nasal discharge is clear and that he has a significant amount of face tightness in the morning until he washes his face and then he opens up and can breathe better.  At nighttime when he lays down his nasal congestion returns and he is having trouble breathing.  He has had an intermittent nonproductive cough intermittent wheezing.  He also endorses an intermittent slight sore throat.  Patient denies fever, ear pain, shortness of breath, body aches, or GI complaints.  Since initial evaluation at this urgent care was in May 2021 and he was diagnosed with postnasal drip at that time.  Past Medical History:  Diagnosis Date   Arthritis    Asthma    Elevated PSA    Glaucoma    HLD (hyperlipidemia)    HTN (hypertension)     Patient Active Problem List   Diagnosis Date Noted   Benign prostatic hyperplasia 11/17/2018   Hyperlipidemia 11/17/2018   Hypertension 11/17/2018   Lumbar disc disease 11/17/2018   Elevated PSA 08/18/2016   Anemia 12/29/2015    History reviewed. No pertinent surgical history.     Home Medications    Prior to Admission medications    Medication Sig Start Date End Date Taking? Authorizing Provider  amoxicillin-clavulanate (AUGMENTIN) 875-125 MG tablet Take 1 tablet by mouth every 12 (twelve) hours for 10 days. 02/12/21 02/22/21 Yes Margarette Canada, NP  amLODipine (NORVASC) 10 MG tablet Take by mouth. 12/11/15   [provider]  atorvastatin (LIPITOR) 10 MG tablet Take 10 mg by mouth daily. 06/05/20   [provider]  cetirizine (ZYRTEC ALLERGY) 10 MG tablet Take 1 tablet (10 mg total) by mouth daily. 08/23/19   Coral Spikes, DO  hydrochlorothiazide (HYDRODIURIL) 25 MG tablet Take by mouth. 12/29/15   [provider]  ipratropium (ATROVENT) 0.06 % nasal spray Place 2 sprays into both nostrils 4 (four) times daily. 02/12/21   Margarette Canada, NP  potassium chloride SA (K-DUR,KLOR-CON) 20 MEQ tablet Take by mouth. 09/07/17   [provider]  pseudoephedrine (SUDAFED) 30 MG tablet Take 30 mg by mouth every 4 (four) hours as needed for congestion.    [provider]    Family History Family History  Problem Relation Age of Onset   Prostate cancer Father    Kidney cancer Neg Hx    Bladder Cancer Neg Hx     Social History Social History   Tobacco Use   Smoking status: Some Days    Types: Cigars   Smokeless tobacco: Current  Types: Chew  Substance Use Topics   Alcohol use: Yes   Drug use: No     Allergies   Patient has no known allergies.   Review of Systems Review of Systems  Constitutional:  Negative for activity change, appetite change and fever.  HENT:  Positive for congestion, rhinorrhea, sinus pressure and sore throat. Negative for ear pain.   Respiratory:  Positive for cough. Negative for shortness of breath and wheezing.   Gastrointestinal:  Negative for diarrhea, nausea and vomiting.  Musculoskeletal:  Negative for arthralgias and myalgias.  Skin:  Negative for rash.  Hematological: Negative.   Psychiatric/Behavioral: Negative.      Physical Exam Triage Vital  Signs ED Triage Vitals  Enc Vitals Group     BP 02/12/21 0853 (!) 156/82     Pulse Rate 02/12/21 0853 79     Resp 02/12/21 0853 18     Temp 02/12/21 0853 98.2 F (36.8 C)     Temp Source 02/12/21 0853 Oral     SpO2 02/12/21 0853 100 %     Weight --      Height --      Head Circumference --      Peak Flow --      Pain Score 02/12/21 0851 0     Pain Loc --      Pain Edu? --      Excl. in [place]? --    No data found.  Updated Vital Signs BP (!) 156/82 (BP Location: Right Arm)   Pulse 79   Temp 98.2 F (36.8 C) (Oral)   Resp 18   SpO2 100%   Visual Acuity Right Eye Distance:   Left Eye Distance:   Bilateral Distance:    Right Eye Near:   Left Eye Near:    Bilateral Near:     Physical Exam Vitals and nursing note reviewed.  Constitutional:      General: He is not in acute distress.    Appearance: Normal appearance. He is not ill-appearing.  HENT:     Head: Normocephalic and atraumatic.     Right Ear: Tympanic membrane, ear canal and external ear normal. There is no impacted cerumen.     Left Ear: Tympanic membrane, ear canal and external ear normal. There is no impacted cerumen.     Nose: Congestion and rhinorrhea present.     Mouth/Throat:     Mouth: Mucous membranes are moist.     Pharynx: Oropharynx is clear. Posterior oropharyngeal erythema present.  Cardiovascular:     Rate and Rhythm: Normal rate and regular rhythm.     Pulses: Normal pulses.     Heart sounds: Normal heart sounds. No murmur heard.   No gallop.  Pulmonary:     Effort: Pulmonary effort is normal.     Breath sounds: Normal breath sounds. No wheezing, rhonchi or rales.  Musculoskeletal:     Cervical back: Normal range of motion and neck supple.  Lymphadenopathy:     Cervical: No cervical adenopathy.  Skin:    General: Skin is warm and dry.     Capillary Refill: Capillary refill takes less than 2 seconds.     Findings: No erythema or rash.  Neurological:     General: No focal deficit  present.     Mental Status: He is alert and oriented to person, place, and time.  Psychiatric:        Mood and Affect: Mood normal.  Behavior: Behavior normal.        Thought Content: Thought content normal.        Judgment: Judgment normal.     UC Treatments / Results  Labs (all labs ordered are listed, but only abnormal results are displayed) Labs Reviewed - No data to display  EKG   Radiology No results found.  Procedures Procedures (including critical care time)  Medications Ordered in UC Medications - No data to display  Initial Impression / Assessment and Plan / UC Course  I have reviewed the triage vital signs and the nursing notes.  Pertinent labs & imaging results that were available during my care of the patient were reviewed by me and considered in my medical decision making (see chart for details).  Patient is a very pleasant, nontoxic-appearing 65 year old male here for evaluation of ongoing respiratory complaints that have been present for over a year and come and go.  He has been treated with Atrovent nasal spray and Zyrtec without resolution of symptoms.  He reports that this Cay Schillings has been present for at least 6 weeks.  His nasal discharge is clear and his cough is intermittent but productive for clear sputum.  Patient's physical exam reveals pearly gray tympanic membranes bilaterally with a normal light retroflexed and no effusion.  External auditory canals are clear.  Nasal mucosa is erythematous and edematous with clear nasal discharge in both nares.  Oropharyngeal exam reveals posterior oropharyngeal erythema and clear postnasal drip.  No cervical lymphadenopathy appreciated on exam.  Cardiopulmonary exam reveals clear lung sounds all fields.  I suspect patient's symptoms are allergic in nature and that he probably needs better management and Zyrtec has to offer.  He has been using Flonase as well.  Due to the protracted duration of time for this present  round of upper respiratory complaints I will do a trial of antibiotics with Augmentin twice daily for 10 days.  I have also encouraged the patient to resume using his Atrovent nasal spray 4 times a day for the next couple days to help decrease swelling so his sinuses can drain and to continue his oral Zyrtec to help with any allergy-like symptoms.  I have also encouraged the patient to perform sinus irrigation 2-3 times a day to help wash out any mucus that is harboring in his sinuses.  I also discussed with the patient that if his symptoms continue unabated by this course of treatment that he may need to see ear nose and throat for further testing and more tailored management to help with his nasal congestion.   Final Clinical Impressions(s) / UC Diagnoses   Final diagnoses:  Upper respiratory tract infection, unspecified type     Discharge Instructions      The Augmentin twice daily with food for 10 days for treatment of your upper respiratory infection.  Perform sinus irrigation 2-3 times a day with a [employer] sinus rinse kit and distilled water.  Do not use tap water.  You can use plain over-the-counter Mucinex every 6 hours to break up the stickiness of the mucus so your body can clear it.  Increase your oral fluid intake to thin out your mucus so that is also able for your body to clear more easily.  Take an over-the-counter probiotic, such as Culturelle-align-activia, 1 hour after each dose of antibiotic to prevent diarrhea.  Use the Atrovent nasal spray, 2 squirts in each nostril every 6 hours, as needed for runny nose and postnasal drip.  Continue your  Zyrtec daily to help manage allergy symptoms.   Return for reevaluation or see your primary care provider for any new or worsening symptoms.   If you develop any new or worsening symptoms return for reevaluation or see your primary care provider.      ED Prescriptions     Medication Sig Dispense Auth. Provider   ipratropium  (ATROVENT) 0.06 % nasal spray Place 2 sprays into both nostrils 4 (four) times daily. 15 mL Margarette Canada, NP   amoxicillin-clavulanate (AUGMENTIN) 875-125 MG tablet Take 1 tablet by mouth every 12 (twelve) hours for 10 days. 20 tablet Margarette Canada, NP      PDMP not reviewed this encounter.   Margarette Canada, NP 02/12/21 [phone]

## 2021-02-12 NOTE — Discharge Instructions (Addendum)
The Augmentin twice daily with food for 10 days for treatment of your upper respiratory infection.  Perform sinus irrigation 2-3 times a day with a [employer]Med sinus rinse kit and distilled water.  Do not use tap water.  You can use plain over-the-counter Mucinex every 6 hours to break up the stickiness of the mucus so your body can clear it.  Increase your oral fluid intake to thin out your mucus so that is also able for your body to clear more easily.  Take an over-the-counter probiotic, such as Culturelle-align-activia, 1 hour after each dose of antibiotic to prevent diarrhea.  Use the Atrovent nasal spray, 2 squirts in each nostril every 6 hours, as needed for runny nose and postnasal drip.  Continue your Zyrtec daily to help manage allergy symptoms.   Return for reevaluation or see your primary care provider for any new or worsening symptoms.   If you develop any new or worsening symptoms return for reevaluation or see your primary care provider.

## 2021-05-20 ENCOUNTER — Other Ambulatory Visit: Payer: Self-pay | Admitting: *Deleted

## 2021-05-20 ENCOUNTER — Other Ambulatory Visit
Admission: RE | Admit: 2021-05-20 | Discharge: 2021-05-20 | Disposition: A | Discharge status: Home / Self Care | Payer: Medicare Other | Attending: Urology | Admitting: Urology

## 2021-05-20 ENCOUNTER — Other Ambulatory Visit: Payer: Self-pay

## 2021-05-20 DIAGNOSIS — R972 Elevated prostate specific antigen [PSA]: Secondary | ICD-10-CM | POA: Diagnosis present

## 2021-05-20 LAB — PSA: Prostatic Specific Antigen: 7.52 ng/mL — ABNORMAL HIGH (ref 0.00–4.00)

## 2021-05-21 ENCOUNTER — Telehealth: Payer: Self-pay | Admitting: *Deleted

## 2021-05-21 DIAGNOSIS — R972 Elevated prostate specific antigen [PSA]: Secondary | ICD-10-CM

## 2021-05-21 NOTE — Telephone Encounter (Addendum)
Patient informed, voiced understanding. Placed order for MRI, aware radiology will contact him in a couple of weeks to get scheduled. Aware to get PSA completed in [facility] 11/2021. Orders placed.    ----- Message from Hollice Espy, MD sent at 05/21/2021 12:46 PM EST ----- PSA has gone up again now to 7.5.  I think it would be reasonable to go ahead and pursue prostate MRI at this point in time.  Please order and schedule.  We can have him follow-up in 6 months as scheduled unless there is any alarming findings on MRI.  Hollice Espy, MD

## 2021-05-29 ENCOUNTER — Ambulatory Visit: Payer: Medicare Other | Admitting: Urology

## 2021-06-01 ENCOUNTER — Telehealth: Payer: Self-pay | Admitting: Urology

## 2021-06-01 NOTE — Telephone Encounter (Signed)
Patient called and stated that he has some questions regarding his MRI that is scheduled for this week.  He would like for a nurse to call him back

## 2021-06-02 NOTE — Telephone Encounter (Signed)
Spoke with patient regarding MRI, answered questions about enema prior to MRI.  Voiced understanding.

## 2021-06-04 ENCOUNTER — Other Ambulatory Visit: Payer: Self-pay

## 2021-06-04 ENCOUNTER — Ambulatory Visit
Admission: RE | Admit: 2021-06-04 | Discharge: 2021-06-04 | Disposition: A | Discharge status: Home / Self Care | Payer: Medicare Other | Source: Ambulatory Visit | Attending: Urology | Admitting: Urology

## 2021-06-04 DIAGNOSIS — R972 Elevated prostate specific antigen [PSA]: Secondary | ICD-10-CM | POA: Diagnosis present

## 2021-06-04 MED ORDER — GADOBUTROL 1 MMOL/ML IV SOLN
10.0000 mL | Freq: Once | INTRAVENOUS | Status: AC | PRN
  Administered 2021-06-04: 10 mL via INTRAVENOUS

## 2021-06-08 ENCOUNTER — Telehealth: Payer: Self-pay | Admitting: *Deleted

## 2021-06-08 ENCOUNTER — Other Ambulatory Visit: Payer: Medicare Other

## 2021-06-08 NOTE — Telephone Encounter (Addendum)
Patient scheduled, voiced understanding ? ? ? ?---- Message from Vanna Scotland, MD sent at 06/08/2021  7:41 AM EST ----- ?Please schedule f/u to discuss ? ?Vanna Scotland, MD ? ?

## 2021-06-09 NOTE — Progress Notes (Signed)
? ?06/10/21 ?2:03 PM  ? ?Maurine Minister Escalona ?[birth year] ?[id number] ? ?Referring provider:  ?Mick Sell, MD ?[address] ?[place],  [place] [address] ?Chief Complaint  ?Patient presents with  ? Results  ? Elevated PSA  ? ? ? ?HPI: ?Peter Whitaker is a 66 y.o.male with a personal history of elevated PSA and BPH who returns today for MRI results.  ? ?His most recent PSA was 7.52 on 05/20/2021. ? ?He underwent a MRI of prostate on 06/04/2021 to further evaluate elevated PSA level, that visualized PI-RADS category 3 lesion of the right posterolateral peripheral zone at the apex, and PI-RADS category 3 lesion of the left anterior ?transition zone near the midline of the mid gland. Targeting data sent to [facility]. Mild prostatomegaly and mild benign prostatic hypertrophy. Indirect right inguinal hernia contains adipose tissue. Fatty left spermatic cord noted. ? ?+ FH of prostate cancer, father dx in late 65s but lived until 17s with prostate cancer.  He was on lupron. ?  ?He is accompanied by his wife today.  ? ?PSA trend: ?Component Prostatic Specific Antigen  ?Latest Ref Rng & Units 0.00 - 4.00 ng/mL  ?05/27/2017 5.64 (H)  ?12/09/2017 6.04 (H)  ?11/15/2018 5.36 (H)  ?11/19/2019 5.15 (H)  ?11/12/2020 6.80 (H)  ?05/20/2021 7.52 (H)  ? ? ?PMH: ?Past Medical History:  ?Diagnosis Date  ? Arthritis   ? Asthma   ? Elevated PSA   ? Glaucoma   ? HLD (hyperlipidemia)   ? HTN (hypertension)   ? ? ?Surgical History: ?No past surgical history on file. ? ?Home Medications:  ?Allergies as of 06/10/2021   ?No Known Allergies ?  ? ?  ?Medication List  ?  ? ?  ? Accurate as of June 10, 2021 11:59 PM. If you have any questions, ask your nurse or doctor.  ?  ?  ? ?  ? ?STOP taking these medications   ? ?pseudoephedrine 30 MG tablet ?Commonly known as: SUDAFED ?Stopped by: Vanna Scotland, MD ?  ? ?  ? ?TAKE these medications   ? ?amLODipine 10 MG tablet ?Commonly known as: NORVASC ?Take by mouth. ?  ?atorvastatin 10 MG tablet ?Commonly known  as: LIPITOR ?Take 10 mg by mouth daily. ?  ?cetirizine 10 MG tablet ?Commonly known as: ZyrTEC Allergy ?Take 1 tablet (10 mg total) by mouth daily. ?  ?hydrochlorothiazide 25 MG tablet ?Commonly known as: HYDRODIURIL ?Take by mouth. ?  ?ipratropium 0.06 % nasal spray ?Commonly known as: ATROVENT ?Place 2 sprays into both nostrils 4 (four) times daily. ?  ?potassium chloride SA 20 MEQ tablet ?Commonly known as: KLOR-CON M ?Take by mouth. ?  ?rosuvastatin 5 MG tablet ?Commonly known as: CRESTOR ?Take 5 mg by mouth daily. ?  ? ?  ? ? ?Allergies: No Known Allergies ? ?Family History: ?Family History  ?Problem Relation Age of Onset  ? Prostate cancer Father   ? Kidney cancer Neg Hx   ? Bladder Cancer Neg Hx   ? ? ?Social History:  reports that he has been smoking cigars. His smokeless tobacco use includes chew. He reports current alcohol use. He reports that he does not use drugs. ? ? ?Physical Exam: ?BP (!) 143/80   Pulse 74   Ht 5\' 10"  (1.778 m)   Wt 207 lb (93.9 kg)   BMI 29.70 kg/m?   ?Constitutional:  Alert and oriented, No acute distress. ?HEENT: [place] AT, moist mucus membranes.  Trachea midline, no masses. ?Cardiovascular: No clubbing, cyanosis,  or edema. ?Respiratory: Normal respiratory effort, no increased work of breathing. ?Skin: No rashes, bruises or suspicious lesions. ?Neurologic: Grossly intact, no focal deficits, moving all 4 extremities. ?Psychiatric: Normal mood and affect. ? ? ?Pertinent Imaging: ?CLINICAL DATA:  Elevated PSA level ?  ?EXAM: ?MR PROSTATE WITHOUT AND WITH CONTRAST ?  ?TECHNIQUE: ?Multiplanar multisequence MRI images were obtained of the pelvis ?centered about the prostate. Pre and post contrast images were ?obtained. ?  ?CONTRAST:  89mL GADAVIST GADOBUTROL 1 MMOL/ML IV SOLN ?  ?COMPARISON:  None. ?  ?FINDINGS: ?Prostate: ?  ?Region of interest # 1: PI-RADS category 3 lesion of the right ?posterolateral peripheral zone at the apex, with focally reduced T2 ?signal (image 63, series 9)  but no substantial restriction of ?diffusion or early enhancement. This measures 0.43 cc (1.4 by 0.5 by ?0.7 cm). ?  ?Region of interest # 2: PI-RADS category 3 lesion of the left ?anterior transition zone and adjacent left anterior fibromuscular ?stroma, extending to the midline, with generally well-defined ?reduced T2 signal (image 47, series 9) but with restriction of ?diffusion (image 15, series 8) and reduced ADC map activity (image ?15, series 7). This measures 0.51 cc (1.2 by 1.0 by 0.8 cm). ?  ?Mild encapsulated nodularity is in the transition zone compatible ?with benign prostatic hypertrophy. ?  ?Volume: 3D volumetric analysis: Prostate volume 50.45 cc (5.1 by 4.6 ?by 4.7 cm). ?  ?Transcapsular spread:  Absent ?  ?Seminal vesicle involvement: Absent ?  ?Neurovascular bundle involvement: Absent ?  ?Pelvic adenopathy: Absent ?  ?Bone metastasis: Absent ?  ?Other findings: Indirect right inguinal hernia contains adipose ?tissue. Fatty left spermatic cord noted. Sigmoid colon ?diverticulosis. ?  ?IMPRESSION: ?1. PI-RADS category 3 lesion of the right posterolateral peripheral ?zone at the apex, and PI-RADS category 3 lesion of the left anterior ?transition zone near the midline of the mid gland. Targeting data ?sent to [facility]. ?2. Mild prostatomegaly and mild benign prostatic hypertrophy. ?3. Indirect right inguinal hernia contains adipose tissue. Fatty ?left spermatic cord noted. ?4. Sigmoid colon diverticulosis. ?  ?  ?Electronically Signed ?  By: Gaylyn Rong M.D. ?  On: 06/05/2021 09:15 ? ? ?I have personally reviewed the images and agree with radiologist interpretation.  ? ? ?Assessment & Plan:   ? ?Elevated PSA  ?- We discussed MRI results in detail specifically PI-RADS categories. We dicussed further evaluation of this being repeat PSA versus fusion prostate biopsy. He would like to have a repeat PSA in 6 months.  ?-If his PSA continues to climb in 3 months, we will more strongly encourage him to  pursue the fusion biopsy.  He is agreeable this plan. ? ?Return in 6 months with PSA prior ? ?Tawni Millers as a scribe for Vanna Scotland, MD.,have documented all relevant documentation on the behalf of Vanna Scotland, MD,as directed by  Vanna Scotland, MD while in the presence of Vanna Scotland, MD. ? ?I have reviewed the above documentation for accuracy and completeness, and I agree with the above.  ? ?Vanna Scotland, MD ? ? ?[facility] ?[address], [address] ?[place], [place] [address] ?[phone]

## 2021-06-10 ENCOUNTER — Ambulatory Visit (INDEPENDENT_AMBULATORY_CARE_PROVIDER_SITE_OTHER): Payer: Medicare Other | Admitting: Urology

## 2021-06-10 ENCOUNTER — Other Ambulatory Visit: Payer: Self-pay

## 2021-06-10 VITALS — BP 143/80 | HR 74 | Ht 70.0 in | Wt 207.0 lb

## 2021-06-10 DIAGNOSIS — R972 Elevated prostate specific antigen [PSA]: Secondary | ICD-10-CM

## 2021-11-17 ENCOUNTER — Other Ambulatory Visit: Payer: Medicare Other

## 2021-12-03 ENCOUNTER — Other Ambulatory Visit
Admission: RE | Admit: 2021-12-03 | Discharge: 2021-12-03 | Disposition: A | Discharge status: Home / Self Care | Payer: Medicare Other | Attending: Urology | Admitting: Urology

## 2021-12-03 DIAGNOSIS — R972 Elevated prostate specific antigen [PSA]: Secondary | ICD-10-CM | POA: Insufficient documentation

## 2021-12-03 LAB — PSA: Prostatic Specific Antigen: 6.28 ng/mL — ABNORMAL HIGH (ref 0.00–4.00)

## 2021-12-10 NOTE — Progress Notes (Signed)
12/11/2021 9:50 AM   Peter Whitaker February 09, 1956 [id number]  Referring provider: Mick Sell, MD [address] [place],  [place] [address]  Chief Complaint  Patient presents with   Elevated PSA    HPI: 66 year old male with personal history of elevated PSA returns today for 69-month follow-up.  His most recent PSA is trending back downwards, 6.28.  He had a prostate of the MRI in 06/2021 which showed PI-RADS 3 lesion.  He does have a family history of prostate cancer.  He underwent a negative prostate biopsy in 2018.  He denies any urinary symptoms today.   Component     Latest Ref Rng 05/27/2017 12/09/2017 11/15/2018  Prostatic Specific Antigen     0.00 - 4.00 ng/mL 5.64 (H)  6.04 (H)  5.36 (H)    Component     Latest Ref Rng 11/19/2019 11/12/2020 05/20/2021  Prostatic Specific Antigen     0.00 - 4.00 ng/mL 5.15 (H)  6.80 (H)  7.52 (H)    Component     Latest Ref Rng 12/03/2021  Prostatic Specific Antigen     0.00 - 4.00 ng/mL 6.28 (H)     Legend: (H) High  PMH: Past Medical History:  Diagnosis Date   Arthritis    Asthma    Elevated PSA    Glaucoma    HLD (hyperlipidemia)    HTN (hypertension)     Surgical History: No past surgical history on file.  Home Medications:  Allergies as of 12/11/2021       Reactions   Rosuvastatin Itching        Medication List        Accurate as of December 11, 2021  9:50 AM. If you have any questions, ask your nurse or doctor.          STOP taking these medications    atorvastatin 10 MG tablet Commonly known as: LIPITOR Stopped by: Vanna Scotland, MD   fexofenadine 180 MG tablet Commonly known as: ALLEGRA Stopped by: Vanna Scotland, MD   ipratropium 0.06 % nasal spray Commonly known as: ATROVENT Stopped by: Vanna Scotland, MD   rosuvastatin 5 MG tablet Commonly known as: CRESTOR Stopped by: Vanna Scotland, MD       TAKE these medications    amLODipine 10 MG tablet Commonly known as:  NORVASC Take by mouth.   cetirizine 10 MG tablet Commonly known as: ZyrTEC Allergy Take 1 tablet (10 mg total) by mouth daily.   hydrochlorothiazide 25 MG tablet Commonly known as: HYDRODIURIL Take by mouth.   potassium chloride SA 20 MEQ tablet Commonly known as: KLOR-CON M Take by mouth.        Allergies:  Allergies  Allergen Reactions   Rosuvastatin Itching    Family History: Family History  Problem Relation Age of Onset   Prostate cancer Father    Kidney cancer Neg Hx    Bladder Cancer Neg Hx     Social History:  reports that he has been smoking cigars. His smokeless tobacco use includes chew. He reports current alcohol use. He reports that he does not use drugs.   Physical Exam: BP (!) 181/68   Pulse 79   Ht 5\' 10"  (1.778 m)   Wt 206 lb (93.4 kg)   BMI 29.56 kg/m   Constitutional:  Alert and oriented, No acute distress. HEENT: [place] AT, moist mucus membranes.  Trachea midline, no masses. Cardiovascular: No clubbing, cyanosis, or edema. Respiratory: Normal respiratory effort, no increased work of  breathing. Rectal: Normal sphincter tone.  50 cc prostate, nontender no nodules Skin: No rashes, bruises or suspicious lesions. Neurologic: Grossly intact, no focal deficits, moving all 4 extremities. Psychiatric: Normal mood and affect.   Assessment & Plan:    1. Elevated PSA Longstanding history of elevated PSA with fluctuation  He had extensive evaluation with MRI with a PI-RADS 3 lesion as well as previous negative biopsies  At this point, his PSA is trending back downwards which is reassuring  Rectal exam today was unremarkable  We will plan to continue to follow conservatively which is also his desire   Return in about 1 year (around 12/12/2022) for PSA/ DRE.  Vanna Scotland, MD  [facility] [address], [address] [place], [place] [address] [phone]

## 2021-12-11 ENCOUNTER — Encounter: Payer: Self-pay | Admitting: Urology

## 2021-12-11 ENCOUNTER — Ambulatory Visit: Payer: Medicare Other | Admitting: Urology

## 2021-12-11 VITALS — BP 181/68 | HR 79 | Ht 70.0 in | Wt 206.0 lb

## 2021-12-11 DIAGNOSIS — R972 Elevated prostate specific antigen [PSA]: Secondary | ICD-10-CM

## 2021-12-14 ENCOUNTER — Other Ambulatory Visit: Payer: Medicare Other

## 2021-12-16 ENCOUNTER — Ambulatory Visit: Payer: Medicare Other | Admitting: Urology

## 2022-12-03 ENCOUNTER — Other Ambulatory Visit: Payer: Self-pay

## 2022-12-03 ENCOUNTER — Other Ambulatory Visit
Admission: RE | Admit: 2022-12-03 | Discharge: 2022-12-03 | Disposition: A | Discharge status: Home / Self Care | Payer: Medicare Other | Attending: Urology | Admitting: Urology

## 2022-12-03 DIAGNOSIS — R972 Elevated prostate specific antigen [PSA]: Secondary | ICD-10-CM

## 2022-12-03 LAB — PSA: Prostatic Specific Antigen: 8.53 ng/mL — ABNORMAL HIGH (ref 0.00–4.00)

## 2022-12-10 ENCOUNTER — Ambulatory Visit: Payer: Medicare Other | Admitting: Urology

## 2022-12-10 ENCOUNTER — Encounter: Payer: Self-pay | Admitting: Urology

## 2022-12-10 VITALS — BP 153/73 | HR 79 | Ht 70.0 in | Wt 203.8 lb

## 2022-12-10 DIAGNOSIS — R972 Elevated prostate specific antigen [PSA]: Secondary | ICD-10-CM | POA: Diagnosis not present

## 2022-12-10 NOTE — Progress Notes (Signed)
Marcelle Overlie Plume,acting as a scribe for Vanna Scotland, MD.,have documented all relevant documentation on the behalf of Vanna Scotland, MD,as directed by  Vanna Scotland, MD while in the presence of Vanna Scotland, MD.  12/10/2022 9:02 AM   Peter Whitaker [birth year] [id number]  Referring provider: Mick Sell, MD [address] [place],  [place] [address]  Chief Complaint  Patient presents with   Elevated PSA    HPI: 67 year-old male who returns today for a routine annual follow up. He has a personal history of elevated PSA. He had a prostate MRI in 06/2021 that showed a PI-RADS 3 lesion. He does have a family history of prostate cancer. He had a negative biopsy in 2018.  His most recent PSA on  12/03/2022 is now up to 8.53. His prostate volume is 50. PSA density of 1.7.  Today, he reports no changes in urinary symptoms, maintaining a good stream and emptying well. He does mention experiencing some lower back pain, which he attributes to his back issues.    Prostatic Specific Antigen  Latest Ref Rng 0.00 - 4.00 ng/mL  11/15/2018 5.36 (H)   11/19/2019 5.15 (H)   11/12/2020 6.80 (H)   05/20/2021 7.52 (H)   12/03/2021 6.28 (H)   12/03/2022 8.53 (H)      PMH: Past Medical History:  Diagnosis Date   Arthritis    Asthma    Elevated PSA    Glaucoma    HLD (hyperlipidemia)    HTN (hypertension)      Home Medications:  Allergies as of 12/10/2022       Reactions   Rosuvastatin Itching        Medication List        Accurate as of December 10, 2022  9:02 AM. If you have any questions, ask your nurse or doctor.          STOP taking these medications    cetirizine 10 MG tablet Commonly known as: ZyrTEC Allergy Stopped by: Vanna Scotland       TAKE these medications    amLODipine 10 MG tablet Commonly known as: NORVASC Take by mouth.   fexofenadine 180 MG tablet Commonly known as: ALLEGRA Take 180 mg by mouth at bedtime.   hydrochlorothiazide  25 MG tablet Commonly known as: HYDRODIURIL Take by mouth.   ipratropium 0.06 % nasal spray Commonly known as: ATROVENT Place 2 sprays into both nostrils.   montelukast 10 MG tablet Commonly known as: SINGULAIR Take 1 tablet by mouth at bedtime.   potassium chloride SA 20 MEQ tablet Commonly known as: KLOR-CON M Take 2 tablets by mouth daily.   rosuvastatin 5 MG tablet Commonly known as: CRESTOR Take 5 mg by mouth daily.        Allergies:  Allergies  Allergen Reactions   Rosuvastatin Itching    Family History: Family History  Problem Relation Age of Onset   Prostate cancer Father    Kidney cancer Neg Hx    Bladder Cancer Neg Hx     Social History:  reports that he has been smoking cigars. His smokeless tobacco use includes chew. He reports current alcohol use. He reports that he does not use drugs.   Physical Exam: BP (!) 153/73 (BP Location: Left Arm, Patient Position: Sitting, Cuff Size: Normal)   Pulse 79   Ht 5\' 10"  (1.778 m)   Wt 203 lb 12.8 oz (92.4 kg)   BMI 29.24 kg/m   Constitutional:  Alert  and oriented, No acute distress. HEENT: [place] AT, moist mucus membranes.  Trachea midline, no masses. GU: Rubbery, enlarged prostate with no nodules felt. Presence of hemorrhoids noted. Neurologic: Grossly intact, no focal deficits, moving all 4 extremities. Psychiatric: Normal mood and affect.  Assessment & Plan:    1. Elevated PSA - Continue monitoring PSA levels, chronically elevated but more so today; has had extensive evaluation in the past - Plan to repeat PSA in six months given over acute rise over the past year - If PSA is stable or decreases, no immediate follow-up required; he will be contacted with results. - If PSA continues to rise, further evaluation will be necessary.  Return in about 6 months (around 06/09/2023) for repeat PSA lab, 1 year follow up with Dr.  I have reviewed the above documentation for accuracy and completeness, and I agree with  the above.   Vanna Scotland, MD    [facility] [address] [place], [place] [address] [phone]

## 2023-05-01 IMAGING — MR MR PROSTATE WO/W CM
56 series · 56 of 56 positions shown · IV contrast (10ml Gadavist)
Comparison: None.

CLINICAL DATA: Elevated PSA level

EXAM:
MR PROSTATE WITHOUT AND WITH CONTRAST
TECHNIQUE: Multiplanar multisequence MRI images were obtained of the pelvis
centered about the prostate. Pre and post contrast images were
obtained.
CONTRAST:  10mL GADAVIST GADOBUTROL 1 MMOL/ML IV SOLN

[Series 3: ax in&out whole · axial · 6.0mm · 0.74mm/px · 1 of 35 slices shown]
[im 1/35]
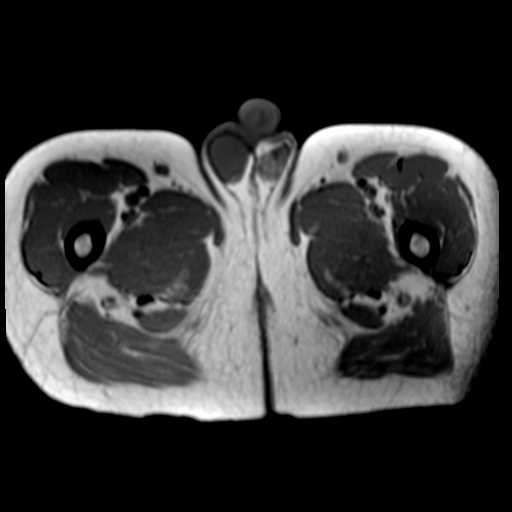

[Series 4: T2 · coronal · 3.0mm · 0.70mm/px · 1 of 35 slices shown (1 of 3)]
[im 1/35]
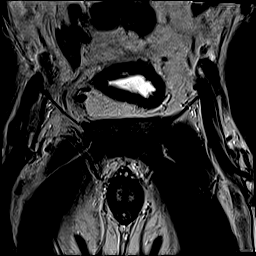

[Series 5: T2 · axial · 3.0mm · 0.56mm/px · 1 of 25 slices shown (2 of 3)]
[im 1/25]
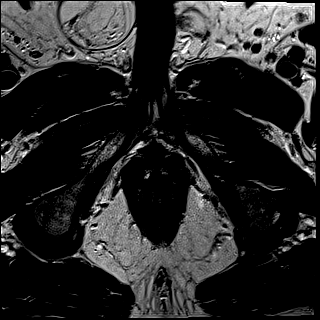

[Series 6: DWI · axial · 3.0mm · 0.86mm/px · 1 of 78 slices shown (1 of 3)]
[im 1/78]
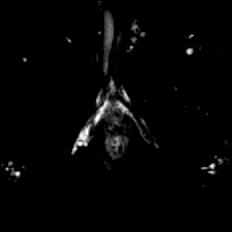

[Series 7: DWI · axial · 3.0mm · 0.86mm/px · 1 of 26 slices shown (2 of 3)]
[im 1/26]
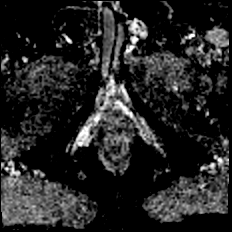

[Series 8: DWI · axial · 3.0mm · 0.86mm/px · 1 of 26 slices shown (3 of 3)]
[im 1/26]
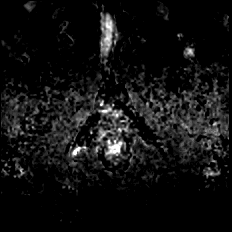

[Series 9: T2 · axial · 1.0mm · 1.04mm/px · 1 of 80 slices shown (3 of 3)]
[im 1/80]
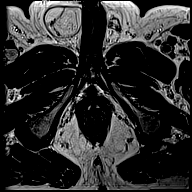

[Series 10: T1 · axial · 3.0mm · 1.15mm/px · 1 of 28 slices shown (1 of 49)]
[im 1/28]
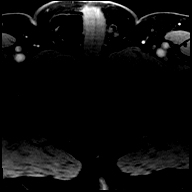

[Series 11: T1 · axial · 3.0mm · 1.15mm/px · 1 of 28 slices shown (2 of 49)]
[im 1/28]
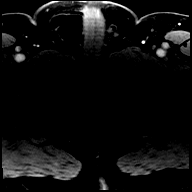

[Series 12: T1 · axial · 3.0mm · 1.15mm/px · 1 of 28 slices shown (3 of 49)]
[im 1/28]
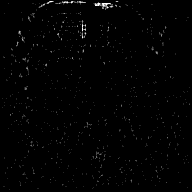

[Series 13: T1 · axial · 3.0mm · 1.15mm/px · 1 of 28 slices shown (4 of 49)]
[im 1/28]
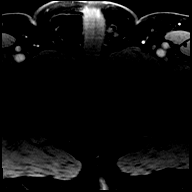

[Series 14: T1 · axial · 3.0mm · 1.15mm/px · 1 of 28 slices shown (5 of 49)]
[im 1/28]
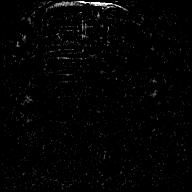

[Series 15: T1 · axial · 3.0mm · 1.15mm/px · 1 of 28 slices shown (6 of 49)]
[im 1/28]
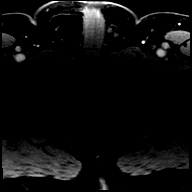

[Series 16: T1 · axial · 3.0mm · 1.15mm/px · 1 of 28 slices shown (7 of 49)]
[im 1/28]
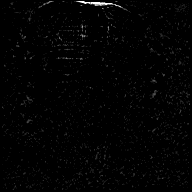

[Series 17: T1 · axial · 3.0mm · 1.15mm/px · 1 of 28 slices shown (8 of 49)]
[im 1/28]
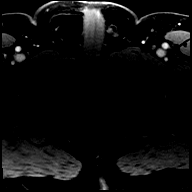

[Series 18: T1 · axial · 3.0mm · 1.15mm/px · 1 of 28 slices shown (9 of 49)]
[im 1/28]
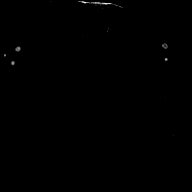

[Series 19: T1 · axial · 3.0mm · 1.15mm/px · 1 of 28 slices shown (10 of 49)]
[im 1/28]
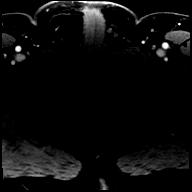

[Series 20: T1 · axial · 3.0mm · 1.15mm/px · 1 of 28 slices shown (11 of 49)]
[im 1/28]
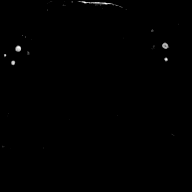

[Series 21: T1 · axial · 3.0mm · 1.15mm/px · 1 of 28 slices shown (12 of 49)]
[im 1/28]
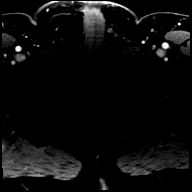

[Series 22: T1 · axial · 3.0mm · 1.15mm/px · 1 of 28 slices shown (13 of 49)]
[im 1/28]
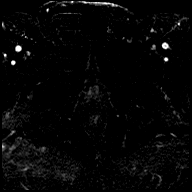

[Series 23: T1 · axial · 3.0mm · 1.15mm/px · 1 of 28 slices shown (14 of 49)]
[im 1/28]
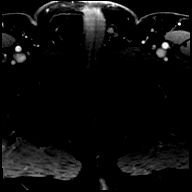

[Series 24: T1 · axial · 3.0mm · 1.15mm/px · 1 of 28 slices shown (15 of 49)]
[im 1/28]
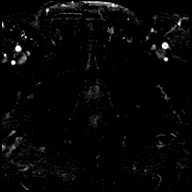

[Series 25: T1 · axial · 3.0mm · 1.15mm/px · 1 of 28 slices shown (16 of 49)]
[im 1/28]
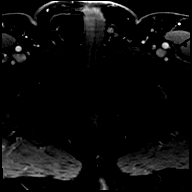

[Series 26: T1 · axial · 3.0mm · 1.15mm/px · 1 of 28 slices shown (17 of 49)]
[im 1/28]
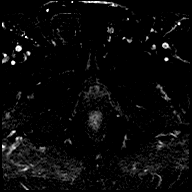

[Series 27: T1 · axial · 3.0mm · 1.15mm/px · 1 of 28 slices shown (18 of 49)]
[im 1/28]
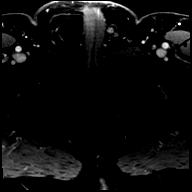

[Series 28: T1 · axial · 3.0mm · 1.15mm/px · 1 of 28 slices shown (19 of 49)]
[im 1/28]
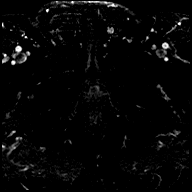

[Series 29: T1 · axial · 3.0mm · 1.15mm/px · 1 of 28 slices shown (20 of 49)]
[im 1/28]
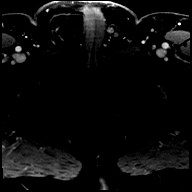

[Series 30: T1 · axial · 3.0mm · 1.15mm/px · 1 of 28 slices shown (21 of 49)]
[im 1/28]
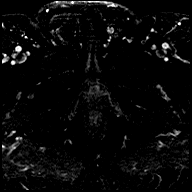

[Series 31: T1 · axial · 3.0mm · 1.15mm/px · 1 of 28 slices shown (22 of 49)]
[im 1/28]
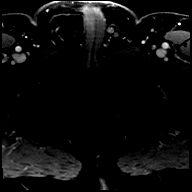

[Series 32: T1 · axial · 3.0mm · 1.15mm/px · 1 of 28 slices shown (23 of 49)]
[im 1/28]
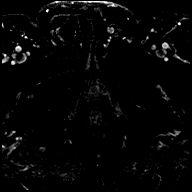

[Series 33: T1 · axial · 3.0mm · 1.15mm/px · 1 of 28 slices shown (24 of 49)]
[im 1/28]
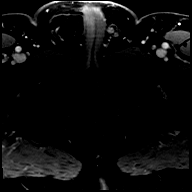

[Series 34: T1 · axial · 3.0mm · 1.15mm/px · 1 of 28 slices shown (25 of 49)]
[im 1/28]
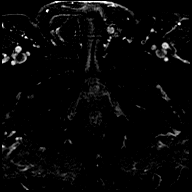

[Series 35: T1 · axial · 3.0mm · 1.15mm/px · 1 of 28 slices shown (26 of 49)]
[im 1/28]
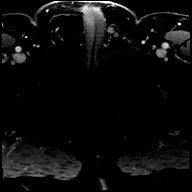

[Series 36: T1 · axial · 3.0mm · 1.15mm/px · 1 of 28 slices shown (27 of 49)]
[im 1/28]
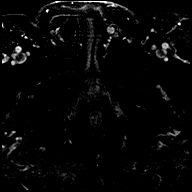

[Series 37: T1 · axial · 3.0mm · 1.15mm/px · 1 of 28 slices shown (28 of 49)]
[im 1/28]
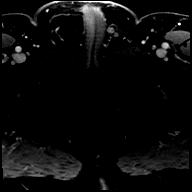

[Series 38: T1 · axial · 3.0mm · 1.15mm/px · 1 of 28 slices shown (29 of 49)]
[im 1/28]
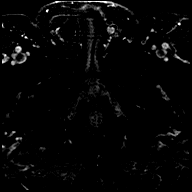

[Series 39: T1 · axial · 3.0mm · 1.15mm/px · 1 of 28 slices shown (30 of 49)]
[im 1/28]
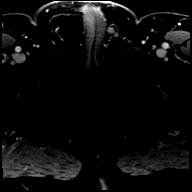

[Series 40: T1 · axial · 3.0mm · 1.15mm/px · 1 of 28 slices shown (31 of 49)]
[im 1/28]
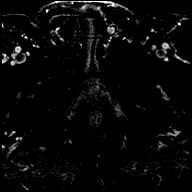

[Series 41: T1 · axial · 3.0mm · 1.15mm/px · 1 of 28 slices shown (32 of 49)]
[im 1/28]
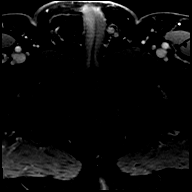

[Series 42: T1 · axial · 3.0mm · 1.15mm/px · 1 of 28 slices shown (33 of 49)]
[im 1/28]
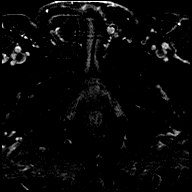

[Series 43: T1 · axial · 3.0mm · 1.15mm/px · 1 of 28 slices shown (34 of 49)]
[im 1/28]
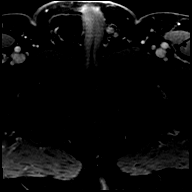

[Series 44: T1 · axial · 3.0mm · 1.15mm/px · 1 of 28 slices shown (35 of 49)]
[im 1/28]
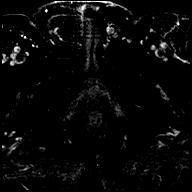

[Series 45: T1 · axial · 3.0mm · 1.15mm/px · 1 of 28 slices shown (36 of 49)]
[im 1/28]
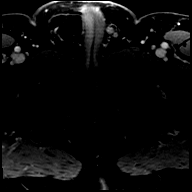

[Series 46: T1 · axial · 3.0mm · 1.15mm/px · 1 of 28 slices shown (37 of 49)]
[im 1/28]
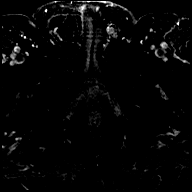

[Series 47: T1 · axial · 3.0mm · 1.15mm/px · 1 of 28 slices shown (38 of 49)]
[im 1/28]
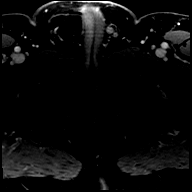

[Series 48: T1 · axial · 3.0mm · 1.15mm/px · 1 of 28 slices shown (39 of 49)]
[im 1/28]
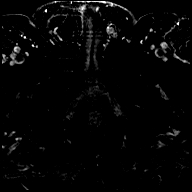

[Series 49: T1 · axial · 3.0mm · 1.15mm/px · 1 of 28 slices shown (40 of 49)]
[im 1/28]
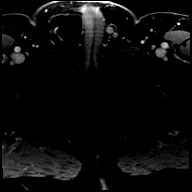

[Series 50: T1 · axial · 3.0mm · 1.15mm/px · 1 of 28 slices shown (41 of 49)]
[im 1/28]
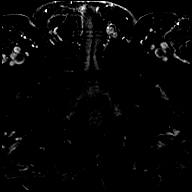

[Series 51: T1 · axial · 3.0mm · 1.15mm/px · 1 of 28 slices shown (42 of 49)]
[im 1/28]
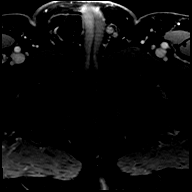

[Series 52: T1 · axial · 3.0mm · 1.15mm/px · 1 of 28 slices shown (43 of 49)]
[im 1/28]
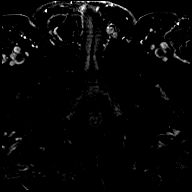

[Series 53: T1 · axial · 3.0mm · 1.15mm/px · 1 of 28 slices shown (44 of 49)]
[im 1/28]
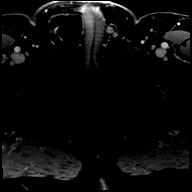

[Series 54: T1 · axial · 3.0mm · 1.15mm/px · 1 of 28 slices shown (45 of 49)]
[im 1/28]
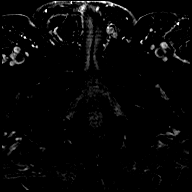

[Series 55: T1 · axial · 3.0mm · 1.15mm/px · 1 of 28 slices shown (46 of 49)]
[im 1/28]
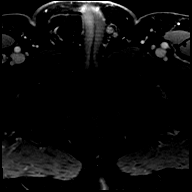

[Series 56: T1 · axial · 3.0mm · 1.15mm/px · 1 of 28 slices shown (47 of 49)]
[im 1/28]
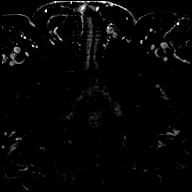

[Series 57: T1 · axial · 3.0mm · 1.15mm/px · 1 of 28 slices shown (48 of 49)]
[im 1/28]
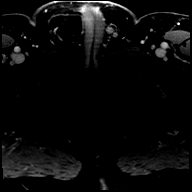

[Series 58: T1 · axial · 3.0mm · 1.15mm/px · 1 of 28 slices shown (49 of 49)]
[im 1/28]
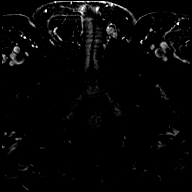

[56 of 56 positions shown; findings below may reference images not displayed]

FINDINGS: Prostate:

Region of interest # 1: PI-RADS category 3 lesion of the right
posterolateral peripheral zone at the apex, with focally reduced T2
signal (image 63, series 9) but no substantial restriction of
diffusion or early enhancement. This measures 0.43 cc (1.4 by 0.5 by
0.7 cm).

Region of interest # 2: PI-RADS category 3 lesion of the left
anterior transition zone and adjacent left anterior fibromuscular
stroma, extending to the midline, with generally well-defined
reduced T2 signal (image 47, series 9) but with restriction of
diffusion (image 15, series 8) and reduced ADC map activity (image
15, series 7). This measures 0.51 cc (1.2 by 1.0 by 0.8 cm).

Mild encapsulated nodularity is in the transition zone compatible
with benign prostatic hypertrophy.

Volume: 3D volumetric analysis: Prostate volume 50.45 cc (5.1 by
by 4.7 cm).

Transcapsular spread:  Absent

Seminal vesicle involvement: Absent

Neurovascular bundle involvement: Absent

Pelvic adenopathy: Absent

Bone metastasis: Absent

Other findings: Indirect right inguinal hernia contains adipose
tissue. Fatty left spermatic cord noted. Sigmoid colon
diverticulosis.
IMPRESSION: 1. PI-RADS category 3 lesion of the right posterolateral peripheral
zone at the apex, and PI-RADS category 3 lesion of the left anterior
transition zone near the midline of the mid gland. Targeting data
sent to [facility].
2. Mild prostatomegaly and mild benign prostatic hypertrophy.
3. Indirect right inguinal hernia contains adipose tissue. Fatty
left spermatic cord noted.
4. Sigmoid colon diverticulosis.

## 2023-07-20 ENCOUNTER — Encounter: Payer: Self-pay | Admitting: Urology

## 2023-12-01 ENCOUNTER — Other Ambulatory Visit
Admission: RE | Admit: 2023-12-01 | Discharge: 2023-12-01 | Disposition: A | Discharge status: Home / Self Care | Attending: Urology | Admitting: Urology

## 2023-12-01 DIAGNOSIS — R972 Elevated prostate specific antigen [PSA]: Secondary | ICD-10-CM | POA: Diagnosis present

## 2023-12-01 LAB — PSA: Prostatic Specific Antigen: 9.03 ng/mL — ABNORMAL HIGH (ref 0.00–4.00)

## 2023-12-11 NOTE — Progress Notes (Deleted)
 12/12/2023 4:11 PM   Peter Whitaker [birth year] [id number]  Referring provider: Epifanio Alm SQUIBB, MD [address] [place],  [place] [address]  Urological history: 1.  Elevated PSA - PSA (11/2023) 9.03, (09/2023) 9.57 - prostate MRI (2023) PI-RADS 3 lesion - bx for PSA 5.53 (2018) neg  - family history w/ father diagnosed in his 1s and treated with Lupron, and lived to be in his 12s  2. BPH with LU TS - prostate MRI volume (2023) 50.45 cc  No chief complaint on file.  HPI: Peter Whitaker is a 68 y.o. man who presents today for yearly follow up.    Previous records reviewed.  His PSA last year was 8.53, his PSA was then rechecked in June through his PCPs office and it was 9.57, and then more recently in August it was 9.03.  He reports sensation of incomplete bladder emptying, urinary frequency, urinary intermittency, urinary urgency, a weak urinary stream, having to strain to void, nocturia x ***, leaking before being able to reach the restroom, leaking with coughing, leaking without awareness, and post void dribbling.     He is wearing *** pads//depends  daily.    Patient denies any modifying or aggravating factors.  Patient denies any recent UTI's, gross hematuria, dysuria or suprapubic/flank pain.  Patient denies any fevers, chills, nausea or vomiting.  ***  He has a family history of PCa with his father.  Bladder and kidney cancer with his mother.      UA***  PVR***  Serum creatinine (09/2023) 1.0, eGFR 81  Hemoglobin A1c (09/2023) 5.7  Diuretics: hydrochlorothiazide 25 mg   He does not have confidence that he could get and keep an erection, his erections are not firm enough for penetrative intercourse, he has difficulty maintaining his erections,  and he is not finding intercourse satisfactory for him.  ***  Patient still having spontaneous erections.  ***   He denies any pain or curvature with erections.    He is not able to ejaculate, has pain  with ejaculation, and has blood in his ejaculate fluid.    Cholesterol (09/2023) 132   Tried and failed ***  PMH: Past Medical History:  Diagnosis Date   Arthritis    Asthma    Elevated PSA    Glaucoma    HLD (hyperlipidemia)    HTN (hypertension)     Surgical History: No past surgical history on file.  Home Medications:  Allergies as of 12/12/2023       Reactions   Rosuvastatin Itching        Medication List        Accurate as of December 11, 2023  4:11 PM. If you have any questions, ask your nurse or doctor.          amLODipine 10 MG tablet Commonly known as: NORVASC Take by mouth.   fexofenadine 180 MG tablet Commonly known as: ALLEGRA Take 180 mg by mouth at bedtime.   hydrochlorothiazide 25 MG tablet Commonly known as: HYDRODIURIL Take by mouth.   ipratropium 0.06 % nasal spray Commonly known as: ATROVENT  Place 2 sprays into both nostrils.   montelukast 10 MG tablet Commonly known as: SINGULAIR Take 1 tablet by mouth at bedtime.   potassium chloride SA 20 MEQ tablet Commonly known as: KLOR-CON M Take 2 tablets by mouth daily.   rosuvastatin 5 MG tablet Commonly known as: CRESTOR Take 5 mg by mouth daily.        Allergies:  Allergies  Allergen Reactions   Rosuvastatin Itching    Family History: Family History  Problem Relation Age of Onset   Prostate cancer Father    Kidney cancer Neg Hx    Bladder Cancer Neg Hx     Social History:  reports that he has been smoking cigars. His smokeless tobacco use includes chew. He reports current alcohol use. He reports that he does not use drugs.  ROS: Pertinent ROS in HPI  Physical Exam: There were no vitals taken for this visit.  Constitutional:  Well nourished. Alert and oriented, No acute distress. HEENT: [place] AT, moist mucus membranes.  Trachea midline, no masses. Cardiovascular: No clubbing, cyanosis, or edema. Respiratory: Normal respiratory effort, no increased work of  breathing. GI: Abdomen is soft, non tender, non distended, no abdominal masses. Liver and spleen not palpable.  No hernias appreciated.  Stool sample for occult testing is not indicated.   GU: No CVA tenderness.  No bladder fullness or masses.  Patient with circumcised/uncircumcised phallus. ***Foreskin easily retracted***  Urethral meatus is patent.  No penile discharge. No penile lesions or rashes. Scrotum without lesions, cysts, rashes and/or edema.  Testicles are located scrotally bilaterally. No masses are appreciated in the testicles. Left and right epididymis are normal. Rectal: Patient with  normal sphincter tone. Anus and perineum without scarring or rashes. No rectal masses are appreciated. Prostate is approximately *** grams, *** nodules are appreciated. Seminal vesicles are normal. Skin: No rashes, bruises or suspicious lesions. Lymph: No cervical or inguinal adenopathy. Neurologic: Grossly intact, no focal deficits, moving all 4 extremities. Psychiatric: Normal mood and affect.  Laboratory Data: See  EPIC and HPI Urinalysis *** I have reviewed the labs.   Pertinent Imaging: ***  Assessment & Plan:  ***  1. Elevated PSA - PSA continues to increase - prostate exam *** - I have recommended a repeat prostate MRI to evaluate for lesions concerning for clinically significant prostate cancer, and he is in agreement - pending prostate MRI findings, there will be a potential for the need of a prostate biopsy  2. BPH with LU TS - stable, improving, worsening mild, moderate severe symptoms *** - no signs of retention, infection or malignancy *** - PSA up to date  - DRE benign *** - UA benign *** - PVR < 300 cc *** - most bothersome symptoms are *** - encouraged avoiding bladder irritants, fluid restriction before bedtime and timed voiding's - Initiate alpha-blocker (***), discussed side effects *** - Initiate 5 alpha reductase inhibitor (***), discussed side effects *** -  Continue tamsulosin  0.4 mg daily, alfuzosin 10 mg daily, Rapaflo 8 mg daily, terazosin, doxazosin, Cialis 5 mg daily and finasteride 5 mg daily, dutasteride 0.5 mg daily***:refills given - Cannot tolerate medication or medication failure, schedule cystoscopy *** - educated on red flag symptoms: acute retention, gross hematuria, fever, severe pain - advised to call clinic or go to the ED if these occur - return to clinic in *** symptom re-evaluation ***  3. Erectile dysfunction - I explained to the patient that in order to achieve an erection it takes good functioning of the nervous system (parasympathetic and rs, sympathetic, sensory and motor), good blood flow into the erectile tissue of the penis and a desire to have sex - I explained that conditions like diabetes, hypertension, coronary artery disease, peripheral vascular disease, smoking, alcohol consumption, age, sleep apnea and BPH can diminish the ability to have an erection - I explained the ED may be a risk  marker for underlying CVD and he should follow up with PCP for further studies *** - we will obtain a serum testosterone level at this time; if it is abnormal we will need to repeat the study for confirmation *** - A recent study published in Sex Med 2018 Apr 13 revealed moderate to vigorous aerobic exercise for 40 minutes 4 times per week can decrease erectile problems caused by physical inactivity, obesity, hypertension, metabolic syndrome and/or cardiovascular diseases *** - We discussed trying a *** different PDE5 inhibitor, intra-urethral suppositories, intracavernous vasoactive drug injection therapy, vacuum erection devices, LI-ESWT and penile prosthesis implantation     No follow-ups on file.  These notes generated with voice recognition software. I apologize for typographical errors.  Peter Whitaker  [facility] [address]  [address] [place], [place] [address] [phone]

## 2023-12-12 ENCOUNTER — Ambulatory Visit: Admitting: Urology

## 2023-12-12 ENCOUNTER — Encounter: Payer: Self-pay | Admitting: Urology

## 2023-12-12 ENCOUNTER — Other Ambulatory Visit
Admission: RE | Admit: 2023-12-12 | Discharge: 2023-12-12 | Disposition: A | Discharge status: Home / Self Care | Attending: Urology | Admitting: Urology

## 2023-12-12 ENCOUNTER — Other Ambulatory Visit: Payer: Self-pay

## 2023-12-12 VITALS — BP 163/72 | HR 82 | Wt 190.0 lb

## 2023-12-12 DIAGNOSIS — R972 Elevated prostate specific antigen [PSA]: Secondary | ICD-10-CM

## 2023-12-12 DIAGNOSIS — N400 Enlarged prostate without lower urinary tract symptoms: Secondary | ICD-10-CM

## 2023-12-12 LAB — URINALYSIS, COMPLETE (UACMP) WITH MICROSCOPIC
Bacteria, UA: NONE SEEN
Bilirubin Urine: NEGATIVE
Glucose, UA: NEGATIVE mg/dL
Hgb urine dipstick: NEGATIVE
Ketones, ur: NEGATIVE mg/dL
Leukocytes,Ua: NEGATIVE
Nitrite: NEGATIVE
Protein, ur: NEGATIVE mg/dL
Specific Gravity, Urine: 1.015 (ref 1.005–1.030)
Squamous Epithelial / HPF: NONE SEEN /HPF (ref 0–5)
pH: 7 (ref 5.0–8.0)

## 2023-12-12 NOTE — Progress Notes (Signed)
 12/12/2023 10:21 AM   Peter Whitaker 1955-11-02 [id number]  Referring provider: Epifanio Alm SQUIBB, MD [address] [place],  [place] [address]  Urological history: 1.  Elevated PSA - PSA (11/2023) 9.03, (09/2023) 9.57 - prostate MRI (2023) PI-RADS 3 lesion - bx for PSA 5.53 (2018) neg  - family history w/ father diagnosed in his 23s and treated with Lupron, and lived to be in his 50s  2. BPH with LU TS - prostate MRI volume (2023) 50.45 cc  Chief Complaint  Patient presents with   Follow-up   HPI: Peter Whitaker is a 68 y.o. man who presents today for yearly follow up.    Previous records reviewed.  His recent PSA is 9.03.  PSAD 0.18   His PSA last year was 8.53, his PSA was then rechecked in June through his PCPs office and it was 9.57, and then more recently in August it was 9.03.  He reports no sensation of incomplete bladder emptying, no urinary frequency, no urinary intermittency, no urinary urgency, no weak urinary stream, no having to strain to void, no nocturia, no hesitancy and no leakage.  Patient denies any modifying or aggravating factors.  Patient denies any recent UTI's, gross hematuria, dysuria or suprapubic/flank pain.  Patient denies any fevers, chills, nausea or vomiting.    He has a family history of PCa with his father.  Bladder and kidney cancer with his mother.      UA negative  Serum creatinine (09/2023) 1.0, eGFR 81  Hemoglobin A1c (09/2023) 5.7  Diuretics: hydrochlorothiazide 25 mg   He has confidence that he could get and keep an erection, his erections are firm enough for penetrative intercourse, he has no difficulty maintaining his erections,  and he is finding intercourse satisfactory for him.    Patient still having spontaneous erections.   He denies any pain or curvature with erections.    He is able to ejaculate, has no pain with ejaculation, and has no blood in his ejaculate fluid.    Cholesterol (09/2023) 132    PMH: Past Medical History:  Diagnosis Date   Arthritis    Asthma    Elevated PSA    Glaucoma    HLD (hyperlipidemia)    HTN (hypertension)     Surgical History: No past surgical history on file.  Home Medications:  Allergies as of 12/12/2023       Reactions   Rosuvastatin Itching        Medication List        Accurate as of December 12, 2023 10:21 AM. If you have any questions, ask your nurse or doctor.          amLODipine 10 MG tablet Commonly known as: NORVASC Take by mouth.   fexofenadine 180 MG tablet Commonly known as: ALLEGRA Take 180 mg by mouth at bedtime.   hydrochlorothiazide 25 MG tablet Commonly known as: HYDRODIURIL Take by mouth.   ipratropium 0.06 % nasal spray Commonly known as: ATROVENT  Place 2 sprays into both nostrils.   montelukast 10 MG tablet Commonly known as: SINGULAIR Take 1 tablet by mouth at bedtime.   potassium chloride SA 20 MEQ tablet Commonly known as: KLOR-CON M Take 2 tablets by mouth daily.   rosuvastatin 5 MG tablet Commonly known as: CRESTOR Take 5 mg by mouth daily.        Allergies:  Allergies  Allergen Reactions   Rosuvastatin Itching    Family History: Family History  Problem  Relation Age of Onset   Prostate cancer Father    Kidney cancer Neg Hx    Bladder Cancer Neg Hx     Social History:  reports that he has been smoking cigars. His smokeless tobacco use includes chew. He reports current alcohol use. He reports that he does not use drugs.  ROS: Pertinent ROS in HPI  Physical Exam: BP (!) 163/72 (BP Location: Left Arm, Patient Position: Sitting, Cuff Size: Normal)   Pulse 82   Wt 190 lb (86.2 kg)   SpO2 98%   BMI 27.26 kg/m   Constitutional:  Well nourished. Alert and oriented, No acute distress. HEENT: [place] AT, moist mucus membranes.  Trachea midline Cardiovascular: No clubbing, cyanosis, or edema. Respiratory: Normal respiratory effort, no increased work of breathing. GU and  Rectal:  Patient deferred exam Neurologic: Grossly intact, no focal deficits, moving all 4 extremities. Psychiatric: Normal mood and affect.  Laboratory Data: See  EPIC and HPI Urinalysis Component     Latest Ref Rng 12/12/2023  Color, Urine     YELLOW  YELLOW   Appearance     CLEAR  CLEAR   Specific Gravity, Urine     1.005 - 1.030  1.015   pH     5.0 - 8.0  7.0   Glucose, UA     NEGATIVE mg/dL NEGATIVE   Hgb urine dipstick     NEGATIVE  NEGATIVE   Bilirubin Urine     NEGATIVE  NEGATIVE   Ketones, ur     NEGATIVE mg/dL NEGATIVE   Protein     NEGATIVE mg/dL NEGATIVE   Nitrite     NEGATIVE  NEGATIVE   Leukocytes,Ua     NEGATIVE  NEGATIVE   Squamous Epithelial / HPF     0 - 5 /HPF NONE SEEN   WBC, UA     0 - 5 WBC/hpf 0-5   RBC / HPF     0 - 5 RBC/hpf 0-5   Bacteria, UA     NONE SEEN  NONE SEEN   I have reviewed the labs.   Pertinent Imaging: N/A  Assessment & Plan:    1. Elevated PSA - PSA continues to increase - prostate exam patient declined  - I have recommended a repeat prostate MRI to evaluate for lesions concerning for clinically significant prostate cancer, and he is not in agreement - He is not wanting any surgery or radiation or any other type of treatment if he was found to have prostate cancer, so he does not see the need to have DRE or a prostate MRI at this time because he would not pursue any abnormal findings - We discussed that his father was able to live into his 90s because his prostate cancer was treated, Mr. Judson expresses understanding of this and then shared with me several stories of other family members who have had surgeries/treatments for various types of cancers and they did not have good outcomes according to Mr. Greenley - I explained that everyone's outcome is different and that does not necessarily mean that he would have a bad outcome, but he still declined any further workup for his elevated PSA - He did agree to come back in 6  months for free and total PSA and also a symptom check so that he can decide on advance planning or palliative treatment if he should develop symptoms  2. BPH  - no symptoms - PSA up to date  - UA benign  -  educated on red flag symptoms: acute retention, gross hematuria, fever, severe pain - advised to call clinic or go to the ED if these occur - return to clinic in 6 months symptom re-evaluation    Return in about 6 months (around 06/10/2024) for Free and total PSA and symptom recheck.  These notes generated with voice recognition software. I apologize for typographical errors.  CLOTILDA HELON RIGGERS  [facility] [address]  [address] [place], [place] [address] [phone]

## 2023-12-16 ENCOUNTER — Ambulatory Visit: Payer: Self-pay | Admitting: Urology

## 2024-03-23 ENCOUNTER — Other Ambulatory Visit: Payer: Self-pay | Admitting: Infectious Diseases

## 2024-03-23 DIAGNOSIS — R109 Unspecified abdominal pain: Secondary | ICD-10-CM

## 2024-03-23 DIAGNOSIS — R195 Other fecal abnormalities: Secondary | ICD-10-CM

## 2024-03-23 DIAGNOSIS — Z1331 Encounter for screening for depression: Secondary | ICD-10-CM

## 2024-03-23 NOTE — Progress Notes (Signed)
 ENCOUNTER: Patient Class :No patient class for patient encounter Department: [facility] CLINIC [address] [place] [address]  PATIENT: Patient Demographics      Name Patient ID SSN Gender Identity Birth Date   Brysin, Towery [id number] Male September 29, 2055 (68 yrs)          Address Phone Email       [address] [place] [address] [phone] [place]) --            [place] Race         [place]em[place] or African American             Reg Status PCP Date Last Verified Next Review Date     Verified Epifanio Alm [place] [phone] 03/16/24 04/15/24           Marital Status Religion Language       Married Unknown-Patient Declined English              EMERGENCY CONTACT: Name Relationship Lgl Grd Work Administrator, Sports  1. Cramer,PATTY Spouse   [phone]     GUARANTOR: There is no guarantor information entered for this encounter.  COVERAGE: Primary Visit Coverage      Payer Plan Group Number Group Name Payer Phone Plan Phone   No coverage found                Secondary Visit Coverage      Payer Plan Group Number Group Name Payer Phone Plan Phone   No coverage found                Primary Coverage      Payer Plan Group Number Group Name Payer Phone Plan Phone   [facility] MEDICARE ADVANTAGE PLAN [facility] [place] MEDICARE [place] [id number]   [phone]           Primary Subscriber      Subscriber ID Subscriber Name [facility] Subscriber Address   [id number] [place], [place] [id number]      [place], [place] [address]           Secondary Coverage      Payer Plan Group Number Group Name Payer Phone Plan Phone   No coverage found

## 2024-03-27 ENCOUNTER — Inpatient Hospital Stay

## 2024-03-27 ENCOUNTER — Encounter: Payer: Self-pay | Admitting: Oncology

## 2024-03-27 ENCOUNTER — Inpatient Hospital Stay: Attending: Oncology | Admitting: Oncology

## 2024-03-27 VITALS — BP 139/79 | HR 80 | Temp 96.8°F | Resp 18 | Ht 70.0 in | Wt 192.5 lb

## 2024-03-27 DIAGNOSIS — Z79899 Other long term (current) drug therapy: Secondary | ICD-10-CM | POA: Diagnosis not present

## 2024-03-27 DIAGNOSIS — F17220 Nicotine dependence, chewing tobacco, uncomplicated: Secondary | ICD-10-CM | POA: Diagnosis not present

## 2024-03-27 DIAGNOSIS — Z8042 Family history of malignant neoplasm of prostate: Secondary | ICD-10-CM | POA: Diagnosis not present

## 2024-03-27 DIAGNOSIS — D649 Anemia, unspecified: Secondary | ICD-10-CM | POA: Diagnosis not present

## 2024-03-27 DIAGNOSIS — D472 Monoclonal gammopathy: Secondary | ICD-10-CM

## 2024-03-27 LAB — COMPREHENSIVE METABOLIC PANEL WITH GFR
ALT: 22 U/L (ref 0–44)
AST: 27 U/L (ref 15–41)
Albumin: 4.7 g/dL (ref 3.5–5.0)
Alkaline Phosphatase: 89 U/L (ref 38–126)
Anion gap: 11 (ref 5–15)
BUN: 21 mg/dL (ref 8–23)
CO2: 29 mmol/L (ref 22–32)
Calcium: 10.5 mg/dL — ABNORMAL HIGH (ref 8.9–10.3)
Chloride: 100 mmol/L (ref 98–111)
Creatinine, Ser: 1.08 mg/dL (ref 0.61–1.24)
GFR, Estimated: >60 mL/min
Glucose, Bld: 162 mg/dL — ABNORMAL HIGH (ref 70–99)
Potassium: 3.3 mmol/L — ABNORMAL LOW (ref 3.5–5.1)
Sodium: 139 mmol/L (ref 135–145)
Total Bilirubin: 0.4 mg/dL (ref 0.0–1.2)
Total Protein: 7.9 g/dL (ref 6.5–8.1)

## 2024-03-27 LAB — CBC WITH DIFFERENTIAL/PLATELET
Abs Immature Granulocytes: 0.09 K/uL — ABNORMAL HIGH (ref 0.00–0.07)
Basophils Absolute: 0.1 K/uL (ref 0.0–0.1)
Basophils Relative: 1 %
Eosinophils Absolute: 0.1 K/uL (ref 0.0–0.5)
Eosinophils Relative: 1 %
HCT: 39.4 % (ref 39.0–52.0)
Hemoglobin: 13.8 g/dL (ref 13.0–17.0)
Immature Granulocytes: 1 %
Lymphocytes Relative: 18 %
Lymphs Abs: 1.3 K/uL (ref 0.7–4.0)
MCH: 32.5 pg (ref 26.0–34.0)
MCHC: 35 g/dL (ref 30.0–36.0)
MCV: 92.9 fL (ref 80.0–100.0)
Monocytes Absolute: 0.6 K/uL (ref 0.1–1.0)
Monocytes Relative: 9 %
Neutro Abs: 5 K/uL (ref 1.7–7.7)
Neutrophils Relative %: 70 %
Platelets: 270 K/uL (ref 150–400)
RBC: 4.24 MIL/uL (ref 4.22–5.81)
RDW: 13.1 % (ref 11.5–15.5)
WBC: 7.1 K/uL (ref 4.0–10.5)
nRBC: 0 % (ref 0.0–0.2)

## 2024-03-27 LAB — RETICULOCYTES
Immature Retic Fract: 5.2 % (ref 2.3–15.9)
RBC.: 4.18 MIL/uL — ABNORMAL LOW (ref 4.22–5.81)
Retic Count, Absolute: 61.4 K/uL (ref 19.0–186.0)
Retic Ct Pct: 1.5 % (ref 0.4–3.1)

## 2024-03-27 LAB — IRON AND TIBC
Iron: 93 ug/dL (ref 45–182)
Saturation Ratios: 25 % (ref 17.9–39.5)
TIBC: 375 ug/dL (ref 250–450)
UIBC: 282 ug/dL

## 2024-03-27 LAB — VITAMIN B12: Vitamin B-12: 791 pg/mL (ref 180–914)

## 2024-03-27 LAB — TSH: TSH: 1.33 u[IU]/mL (ref 0.350–4.500)

## 2024-03-27 LAB — FOLATE: Folate: 10.6 ng/mL

## 2024-03-27 NOTE — Progress Notes (Signed)
 New patient; Referral from Dr. Epifanio: MGUS/Anemia due to vitamin B12 deficiency.

## 2024-03-27 NOTE — Progress Notes (Signed)
 "  Hematology/Oncology Consult note [facility] [place] [phone] [phone]  Patient Care Team: Epifanio Alm SQUIBB, MD as PCP - General (Infectious Diseases)   Name of the patient: Peter Whitaker  [id number]  17-Jun-1955    Reason for referral- MGUS   Referring physician-Dr. Epifanio  Date of visit: 03/27/2024   History of presenting illness- Patient is a 68 year old male with a past medical history significant for hypertension hyperlipidemia type 2 diabetes BPH referred for concerns of anemia.  CBC on 03/16/2024 showed white count of 9.3, H&H of 13.7/37.9 with a platelet count of 275.  His baseline hemoglobin has been around 14-15 up until June 2024 and since then it has been around 13.5.  CMP showed a normal calcium creatinine and normal LFTs.  Iron studies showed ferritin level of 323 on 03/16/2024.  SPEP showed a small M protein of 0.4 g  He remains asymptomatic, with no fatigue, changes in energy, appetite loss, or symptoms suggestive of systemic illness. He denies new or unexplained musculoskeletal pain. He has longstanding chronic back pain dating to adolescence, which is stable and unchanged.     ECOG PS- 1  Pain scale- 0   Review of systems- Review of Systems  Constitutional:  Negative for chills, fever, malaise/fatigue and weight loss.  HENT:  Negative for congestion, ear discharge and nosebleeds.   Eyes:  Negative for blurred vision.  Respiratory:  Negative for cough, hemoptysis, sputum production, shortness of breath and wheezing.   Cardiovascular:  Negative for chest pain, palpitations, orthopnea and claudication.  Gastrointestinal:  Negative for abdominal pain, blood in stool, constipation, diarrhea, heartburn, melena, nausea and vomiting.  Genitourinary:  Negative for dysuria, flank pain, frequency, hematuria and urgency.  Musculoskeletal:  Negative for back pain, joint pain and myalgias.  Skin:  Negative for rash.   Neurological:  Negative for dizziness, tingling, focal weakness, seizures, weakness and headaches.  Endo/Heme/Allergies:  Does not bruise/bleed easily.  Psychiatric/Behavioral:  Negative for depression and suicidal ideas. The patient does not have insomnia.     Allergies[1]  Patient Active Problem List   Diagnosis Date Noted   Benign prostatic hyperplasia 11/17/2018   Hyperlipidemia 11/17/2018   Hypertension 11/17/2018   Lumbar disc disease 11/17/2018   Elevated PSA 08/18/2016   Anemia 12/29/2015     Past Medical History:  Diagnosis Date   Arthritis    Asthma    Elevated PSA    Glaucoma    HLD (hyperlipidemia)    HTN (hypertension)      History reviewed. No pertinent surgical history.  Social History   Socioeconomic History   Marital status: Married    Spouse name: Editor, Commissioning   Number of children: 0   Years of education: Not on file   Highest education level: Not on file  Occupational History   Occupation: Nature Conservation Officer  Tobacco Use   Smoking status: Former    Types: Cigars   Smokeless tobacco: Current    Types: Chew  Vaping Use   Vaping status: Never Used  Substance and Sexual Activity   Alcohol use: Yes   Drug use: No   Sexual activity: Not on file  Other Topics Concern   Not on file  Social History Narrative   Not on file   Social Drivers of Health   Tobacco Use: High Risk (03/27/2024)   Patient History    Smoking Tobacco Use: Former    Smokeless Tobacco Use: Current    Passive Exposure: Not on Hospital Doctor  Resource Strain: Low Risk  (03/23/2024)   Received from [facility]   Overall Financial Resource Strain (CARDIA)    Difficulty of Paying Living Expenses: Not hard at all  Food Insecurity: No Food Insecurity (03/27/2024)   Epic    Worried About Running Out of Food in the Last Year: Never true    Ran Out of Food in the Last Year: Never true  Transportation Needs: No Transportation Needs (03/27/2024)   Epic    Lack of  Transportation (Medical): No    Lack of Transportation (Non-Medical): No  Physical Activity: Not on file  Stress: Not on file  Social Connections: Not on file  Intimate Partner Violence: Not At Risk (03/27/2024)   Epic    Fear of Current or Ex-Partner: No    Emotionally Abused: No    Physically Abused: No    Sexually Abused: No  Depression (PHQ2-9): Low Risk (03/27/2024)   Depression (PHQ2-9)    PHQ-2 Score: 0  Alcohol Screen: Not on file  Housing: Low Risk (03/27/2024)   Epic    Unable to Pay for Housing in the Last Year: No    Number of Times Moved in the Last Year: 0    Homeless in the Last Year: No  Utilities: Not At Risk (03/27/2024)   Epic    Threatened with loss of utilities: No  Health Literacy: Not on file     Family History  Problem Relation Age of Onset   Prostate cancer Father    Kidney cancer Neg Hx    Bladder Cancer Neg Hx     Current Medications[2]   Physical exam:  Vitals:   03/27/24 1050  BP: 139/79  Pulse: 80  Resp: 18  Temp: (!) 96.8 F (36 C)  TempSrc: Tympanic  SpO2: 100%  Weight: 192 lb 8 oz (87.3 kg)  Height: 5' 10 (1.778 m)   Physical Exam Cardiovascular:     Rate and Rhythm: Normal rate and regular rhythm.     Heart sounds: Normal heart sounds.  Pulmonary:     Effort: Pulmonary effort is normal.     Breath sounds: Normal breath sounds.  Abdominal:     General: Bowel sounds are normal.     Palpations: Abdomen is soft.  Skin:    General: Skin is warm and dry.  Neurological:     Mental Status: He is alert and oriented to person, place, and time.            No data to display             No data to display          Assessment and plan- Patient is a 68 y.o. male referred for possible MGUS  Assessment and Plan    Monoclonal gammopathy of undetermined significance (MGUS) Newly identified low-level monoclonal protein (0.2 g/dL) consistent with MGUS. Asymptomatic with no progression to multiple myeloma or other  hematologic malignancy. - Ordered additional laboratory studies to characterize monoclonal protein and evaluate for progression including CBC with differential CMP myeloma panel and serum free light chains reticulocyte count B12 folate and iron studies -I will see him back in 2 weeks to discuss the results of blood work.  Discussed differences between MGUS, smoldering multiple myeloma and overt multiple myeloma    Thank you for this kind referral and the opportunity to participate in the care of this patient   Visit Diagnosis 1. MGUS (monoclonal gammopathy of unknown significance)  Dr. Annah Skene, MD, MPH [facility] [phone] 03/27/2024                    [1] No Known Allergies [2]  Current Outpatient Medications:    amLODipine (NORVASC) 10 MG tablet, Take by mouth., Disp: , Rfl:    fexofenadine (ALLEGRA) 180 MG tablet, Take 180 mg by mouth at bedtime., Disp: , Rfl:    hydrochlorothiazide (HYDRODIURIL) 25 MG tablet, Take by mouth., Disp: , Rfl:    ipratropium (ATROVENT ) 0.06 % nasal spray, Place 2 sprays into both nostrils., Disp: , Rfl:    montelukast (SINGULAIR) 10 MG tablet, Take 1 tablet by mouth at bedtime., Disp: , Rfl:    potassium chloride SA (KLOR-CON M) 20 MEQ tablet, Take 2 tablets by mouth daily., Disp: , Rfl:    rosuvastatin (CRESTOR) 5 MG tablet, Take 5 mg by mouth daily., Disp: , Rfl:   "

## 2024-03-28 LAB — KAPPA/LAMBDA LIGHT CHAINS
Kappa free light chain: 27 mg/L — ABNORMAL HIGH (ref 3.3–19.4)
Kappa, lambda light chain ratio: 1.63 (ref 0.26–1.65)
Lambda free light chains: 16.6 mg/L (ref 5.7–26.3)

## 2024-03-28 LAB — PROTEIN ELECTRO, RANDOM URINE
Albumin ELP, Urine: 55.2 %
Alpha-1-Globulin, U: 5.2 %
Alpha-2-Globulin, U: 13.9 %
Beta Globulin, U: 13.8 %
Gamma Globulin, U: 12.1 %
Total Protein, Urine: 14.7 mg/dL

## 2024-03-28 LAB — HAPTOGLOBIN: Haptoglobin: 124 mg/dL (ref 32–363)

## 2024-03-30 ENCOUNTER — Ambulatory Visit
Admission: RE | Admit: 2024-03-30 | Discharge: 2024-03-30 | Disposition: A | Discharge status: Home / Self Care | Source: Ambulatory Visit | Attending: Infectious Diseases | Admitting: Infectious Diseases

## 2024-03-30 DIAGNOSIS — R195 Other fecal abnormalities: Secondary | ICD-10-CM | POA: Insufficient documentation

## 2024-03-30 DIAGNOSIS — R109 Unspecified abdominal pain: Secondary | ICD-10-CM | POA: Insufficient documentation

## 2024-03-30 DIAGNOSIS — Z1331 Encounter for screening for depression: Secondary | ICD-10-CM | POA: Diagnosis present

## 2024-03-30 MED ORDER — IOHEXOL 300 MG/ML  SOLN
100.0000 mL | Freq: Once | INTRAMUSCULAR | Status: AC | PRN
  Administered 2024-03-30: 100 mL via INTRAVENOUS

## 2024-04-02 LAB — MULTIPLE MYELOMA PANEL, SERUM
Albumin SerPl Elph-Mcnc: 4 g/dL (ref 2.9–4.4)
Albumin/Glob SerPl: 1.2 (ref 0.7–1.7)
Alpha 1: 0.2 g/dL (ref 0.0–0.4)
Alpha2 Glob SerPl Elph-Mcnc: 0.8 g/dL (ref 0.4–1.0)
B-Globulin SerPl Elph-Mcnc: 1 g/dL (ref 0.7–1.3)
Gamma Glob SerPl Elph-Mcnc: 1.5 g/dL (ref 0.4–1.8)
Globulin, Total: 3.5 g/dL (ref 2.2–3.9)
IgA: 99 mg/dL (ref 61–437)
IgG (Immunoglobin G), Serum: 1736 mg/dL — ABNORMAL HIGH (ref 603–1613)
IgM (Immunoglobulin M), Srm: 47 mg/dL (ref 20–172)
M Protein SerPl Elph-Mcnc: 0.3 g/dL — ABNORMAL HIGH
Total Protein ELP: 7.5 g/dL (ref 6.0–8.5)

## 2024-04-10 ENCOUNTER — Encounter: Payer: Self-pay | Admitting: Urology

## 2024-04-16 ENCOUNTER — Inpatient Hospital Stay: Attending: Oncology | Admitting: Oncology

## 2024-04-16 ENCOUNTER — Encounter: Payer: Self-pay | Admitting: Oncology

## 2024-04-16 VITALS — BP 141/69 | HR 80 | Temp 97.8°F | Resp 20 | Wt 195.5 lb

## 2024-04-16 DIAGNOSIS — F17210 Nicotine dependence, cigarettes, uncomplicated: Secondary | ICD-10-CM | POA: Diagnosis not present

## 2024-04-16 DIAGNOSIS — K429 Umbilical hernia without obstruction or gangrene: Secondary | ICD-10-CM | POA: Insufficient documentation

## 2024-04-16 DIAGNOSIS — D472 Monoclonal gammopathy: Secondary | ICD-10-CM | POA: Insufficient documentation

## 2024-04-16 DIAGNOSIS — Z8042 Family history of malignant neoplasm of prostate: Secondary | ICD-10-CM | POA: Insufficient documentation

## 2024-04-16 DIAGNOSIS — J45909 Unspecified asthma, uncomplicated: Secondary | ICD-10-CM | POA: Diagnosis not present

## 2024-04-16 DIAGNOSIS — I700 Atherosclerosis of aorta: Secondary | ICD-10-CM | POA: Diagnosis not present

## 2024-04-16 DIAGNOSIS — E785 Hyperlipidemia, unspecified: Secondary | ICD-10-CM | POA: Diagnosis not present

## 2024-04-16 DIAGNOSIS — K5730 Diverticulosis of large intestine without perforation or abscess without bleeding: Secondary | ICD-10-CM | POA: Diagnosis not present

## 2024-04-16 DIAGNOSIS — M129 Arthropathy, unspecified: Secondary | ICD-10-CM | POA: Insufficient documentation

## 2024-04-16 DIAGNOSIS — I10 Essential (primary) hypertension: Secondary | ICD-10-CM | POA: Insufficient documentation

## 2024-04-16 NOTE — Progress Notes (Signed)
 "    Hematology/Oncology Consult note [facility]  [phone] [phone]  Patient Care Team: Epifanio Alm SQUIBB, MD as PCP - General (Infectious Diseases)   Name of the patient: Peter Whitaker  [id number]  1955-12-07   Date of visit: 04/16/2024  Diagnosis-IgG lambda MGUS  Chief complaint/ Reason for visit-discuss results of blood work  Heme/Onc history: Patient is a 69 year old male with a past medical history significant for hypertension hyperlipidemia type 2 diabetes BPH referred for concerns of anemia.  CBC on 03/16/2024 showed white count of 9.3, H&H of 13.7/37.9 with a platelet count of 275.  His baseline hemoglobin has been around 14-15 up until June 2024 and since then it has been around 13.5.  CMP showed a normal calcium creatinine and normal LFTs.  Iron studies showed ferritin level of 323 on 03/16/2024.  SPEP showed a small M protein of 0.4 g  Results of blood work from 03/27/2024 were as follows: CBC showed H&H of 13.8/29.4.  CMP showed mildly elevated calcium of 10.5 and creatinine of 1.08.  Total protein normal at 7.9.  Folate and B12 normal.  Reticulocyte count normal.  Myeloma panel showed IgG lambda M protein 0.3 g.  Haptoglobin TSH normal.  Serum free light chains showed mildly elevated kappa chain of 27 with a normal free light chain ratio 1.63.  Random urine protein electrophoresis did not show any evidence of M protein.  Iron studies were within normal limits    Interval history- Peter Whitaker is a 69 year old male with monoclonal gammopathy of undetermined significance (MGUS) who presents for hematology follow-up.  He has MGUS, initially identified by detection of a low level of abnormal monoclonal protein in the blood, which has remained stable. He reports feeling well overall and does not mention any new or concerning symptoms. Laboratory studies have shown normal hemoglobin and kidney function, according to prior results  discussed with the patient.  He is not receiving any specific therapy for MGUS. He reports that he has not undergone a bone marrow biopsy.  He reports no new or concerning symptoms and feels well overall. He is aware of ongoing prostate issues, which are being managed separately.       ECOG PS- 1 Pain scale- 0   Review of systems- Review of Systems  Constitutional:  Negative for chills, fever, malaise/fatigue and weight loss.  HENT:  Negative for congestion, ear discharge and nosebleeds.   Eyes:  Negative for blurred vision.  Respiratory:  Negative for cough, hemoptysis, sputum production, shortness of breath and wheezing.   Cardiovascular:  Negative for chest pain, palpitations, orthopnea and claudication.  Gastrointestinal:  Negative for abdominal pain, blood in stool, constipation, diarrhea, heartburn, melena, nausea and vomiting.  Genitourinary:  Negative for dysuria, flank pain, frequency, hematuria and urgency.  Musculoskeletal:  Negative for back pain, joint pain and myalgias.  Skin:  Negative for rash.  Neurological:  Negative for dizziness, tingling, focal weakness, seizures, weakness and headaches.  Endo/Heme/Allergies:  Does not bruise/bleed easily.  Psychiatric/Behavioral:  Negative for depression and suicidal ideas. The patient does not have insomnia.       Allergies[1]   Past Medical History:  Diagnosis Date   Arthritis    Asthma    Elevated PSA    Glaucoma    HLD (hyperlipidemia)    HTN (hypertension)      History reviewed. No pertinent surgical history.  Social History   Socioeconomic History   Marital status: Married    Spouse  name: Pattie Das   Number of children: 0   Years of education: Not on file   Highest education level: Not on file  Occupational History   Occupation: Nature Conservation Officer  Tobacco Use   Smoking status: Former    Types: Cigars   Smokeless tobacco: Current    Types: Chew  Vaping Use   Vaping status: Never Used  Substance and  Sexual Activity   Alcohol use: Yes   Drug use: No   Sexual activity: Not on file  Other Topics Concern   Not on file  Social History Narrative   Not on file   Social Drivers of Health   Tobacco Use: High Risk (04/16/2024)   Patient History    Smoking Tobacco Use: Former    Smokeless Tobacco Use: Current    Passive Exposure: Not on Actuary Strain: Low Risk  (03/23/2024)   Received from [facility]   Overall Financial Resource Strain (CARDIA)    Difficulty of Paying Living Expenses: Not hard at all  Food Insecurity: No Food Insecurity (03/27/2024)   Epic    Worried About Radiation Protection Practitioner of Food in the Last Year: Never true    Ran Out of Food in the Last Year: Never true  Transportation Needs: No Transportation Needs (03/27/2024)   Epic    Lack of Transportation (Medical): No    Lack of Transportation (Non-Medical): No  Physical Activity: Not on file  Stress: Not on file  Social Connections: Not on file  Intimate Partner Violence: Not At Risk (03/27/2024)   Epic    Fear of Current or Ex-Partner: No    Emotionally Abused: No    Physically Abused: No    Sexually Abused: No  Depression (PHQ2-9): Low Risk (03/27/2024)   Depression (PHQ2-9)    PHQ-2 Score: 0  Alcohol Screen: Not on file  Housing: Low Risk (03/27/2024)   Epic    Unable to Pay for Housing in the Last Year: No    Number of Times Moved in the Last Year: 0    Homeless in the Last Year: No  Utilities: Not At Risk (03/27/2024)   Epic    Threatened with loss of utilities: No  Health Literacy: Not on file    Family History  Problem Relation Age of Onset   Prostate cancer Father    Kidney cancer Neg Hx    Bladder Cancer Neg Hx     Current Medications[2]  Physical exam:  Vitals:   04/16/24 1504  Weight: 195 lb 8 oz (88.7 kg)   Physical Exam Cardiovascular:     Rate and Rhythm: Normal rate and regular rhythm.     Heart sounds: Normal heart sounds.  Pulmonary:      Effort: Pulmonary effort is normal.     Breath sounds: Normal breath sounds.  Skin:    General: Skin is warm and dry.  Neurological:     Mental Status: He is alert and oriented to person, place, and time.      I have personally reviewed labs listed below:    Latest Ref Rng & Units 03/27/2024   11:42 AM  CMP  Glucose 70 - 99 mg/dL 837   BUN 8 - 23 mg/dL 21   Creatinine 9.38 - 1.24 mg/dL 8.91   Sodium 864 - 854 mmol/L 139   Potassium 3.5 - 5.1 mmol/L 3.3   Chloride 98 - 111 mmol/L 100   CO2 22 - 32 mmol/L 29   Calcium  8.9 - 10.3 mg/dL 89.4   Total Protein 6.5 - 8.1 g/dL 7.9   Total Bilirubin 0.0 - 1.2 mg/dL 0.4   Alkaline Phos 38 - 126 U/L 89   AST 15 - 41 U/L 27   ALT 0 - 44 U/L 22       Latest Ref Rng & Units 03/27/2024   11:42 AM  CBC  WBC 4.0 - 10.5 K/uL 7.1   Hemoglobin 13.0 - 17.0 g/dL 86.1   Hematocrit 60.9 - 52.0 % 39.4   Platelets 150 - 400 K/uL 270    I have personally reviewed Radiology images listed below: No images are attached to the encounter.  CT ABDOMEN PELVIS W CONTRAST Result Date: 04/13/2024 CLINICAL DATA:  Abdomen pain positive fecal immunochemical test EXAM: CT ABDOMEN AND PELVIS WITH CONTRAST TECHNIQUE: Multidetector CT imaging of the abdomen and pelvis was performed using the standard protocol following bolus administration of intravenous contrast. RADIATION DOSE REDUCTION: This exam was performed according to the departmental dose-optimization program which includes automated exposure control, adjustment of the mA and/or kV according to patient size and/or use of iterative reconstruction technique. CONTRAST:  OMNIPAQUE  IOHEXOL  300 MG/ML  SOLN COMPARISON:  MRI 06/04/2021 FINDINGS: Lower chest: Lung bases demonstrate no acute airspace disease. Gynecomastia Hepatobiliary: No focal liver abnormality is seen. No gallstones, gallbladder wall thickening, or biliary dilatation. Pancreas: Unremarkable. No pancreatic ductal dilatation or surrounding  inflammatory changes. Spleen: Normal in size without focal abnormality. Adrenals/Urinary Tract: Adrenal glands are normal. Kidneys show no hydronephrosis. Cyst in the lower pole left kidney, no imaging follow-up is recommended. Bladder is unremarkable Stomach/Bowel: Stomach within normal limits. No dilated small bowel. No acute bowel wall thickening. Negative appendix. Diverticular disease of the colon. Vascular/Lymphatic: Aortic atherosclerosis. No enlarged abdominal or pelvic lymph nodes. Reproductive: Enlarged prostate Other: No ascites or free air. Right greater than left fat containing inguinal hernias. Small fat containing umbilical hernia Musculoskeletal: No acute osseous abnormality IMPRESSION: 1. No CT evidence for acute intra-abdominal or pelvic abnormality. 2. Diverticular disease of the colon without acute inflammatory process. 3. Enlarged prostate. 4. Aortic atherosclerosis. Aortic Atherosclerosis (ICD10-I70.0). Electronically Signed   By: Luke Bun M.D.   On: 04/13/2024 01:15     Assessment and plan- Patient is a 69 y.o. male here for routine follow-up of IgM lambda MGUS  Assessment and Plan    Monoclonal gammopathy of undetermined significance (MGUS) MGUS stable with low monoclonal protein levels, no symptoms or organ dysfunction, normal hemoglobin and renal function.  He falls in the low risk group due to normal free light chain ratio as well as M protein less than 1.5 g.  He does not require bone marrow biopsy at this time. - Advised continuation of usual activities and lifestyle. - Ordered blood work for MGUS monitoring in six months. - Scheduled follow-up visit in one year.         Visit Diagnosis 1. MGUS (monoclonal gammopathy of unknown significance)      Dr. Annah Skene, MD, MPH [facility] [phone] 04/16/2024 3:08 PM                    [1] No Known Allergies [2]  Current Outpatient Medications:    amLODipine  (NORVASC) 10 MG tablet, Take by mouth., Disp: , Rfl:    fexofenadine (ALLEGRA) 180 MG tablet, Take 180 mg by mouth at bedtime., Disp: , Rfl:    hydrochlorothiazide (HYDRODIURIL) 25 MG tablet, Take by mouth., Disp: ,  Rfl:    ipratropium (ATROVENT ) 0.06 % nasal spray, Place 2 sprays into both nostrils., Disp: , Rfl:    montelukast (SINGULAIR) 10 MG tablet, Take 1 tablet by mouth at bedtime., Disp: , Rfl:    potassium chloride SA (KLOR-CON M) 20 MEQ tablet, Take 2 tablets by mouth daily., Disp: , Rfl:    rosuvastatin (CRESTOR) 5 MG tablet, Take 5 mg by mouth daily., Disp: , Rfl:   "

## 2024-04-23 NOTE — H&P (View-Only) (Signed)
 Subjective:   CC: Positive FIT (fecal immunochemical test) [R19.5]  HPI:  referred by Alm Dover Fitzgeral* for evaluation of above.   History of Present Illness Peter Whitaker is a 69 year old male with MGUS and anemia who presents for evaluation following a positive fecal immunochemical test (FIT).  He had a recent positive FIT after prior negative Cologuard screening in 2022. He has never had a colonoscopy and denies hematochezia.  He has anemia, with labs in December 2022 showing mildly decreased hemoglobin and hematocrit.  He reports intermittent right lower quadrant abdominal pain and was told prior imaging suggested a hernia.    Past Medical History:  has a past medical history of BPH (benign prostatic hypertrophy), Hyperlipidemia, Hypertension, Lumbar disc disease, and Type II or unspecified type diabetes mellitus without mention of complication, not stated as uncontrolled (CMS/HHS-HCC).  Past Surgical History:  has no past surgical history on file.  Family History: family history includes Aneurysm in his brother; Coronary Artery Disease (Blocked arteries around heart) in his mother; Diabetes type II in his sister; High blood pressure (Hypertension) in his sister, sister, and sister; Prostate cancer in his father; Stroke in his brother, father, and mother.  Social History:  reports that he has been smoking cigars. He has been exposed to tobacco smoke. His smokeless tobacco use includes chew. He reports current alcohol use. He reports that he does not use drugs.  Current Medications: has a current medication list which includes the following prescription(s): amlodipine, aspirin, cetirizine , fexofenadine, hydrochlorothiazide, montelukast, potassium chloride, and rosuvastatin.  Allergies:  Allergies  Allergen Reactions   Rosuvastatin Itching    ROS:  A 15 point review of systems was performed and pertinent positives and negatives noted in HPI   Objective:     BP  109/64   Pulse 64   Ht 177.8 cm (5' 10)   Wt 87.5 kg (193 lb)   BMI 27.69 kg/m   Constitutional :  No distress, cooperative, alert  Lymphatics/Throat:  Supple with no lymphadenopathy  Respiratory:  Clear to auscultation bilaterally  Cardiovascular:  Regular rate and rhythm  Gastrointestinal: Soft, non-tender, non-distended, no organomegaly. Bilateral inguinal hernia and umbilical hernia, reducible.  Musculoskeletal: Steady gait and movement  Skin: Cool and moist  Psychiatric: Normal affect, non-agitated, not confused         LABS:  Positive FIT test  RADS: CLINICAL DATA:  Abdomen pain positive fecal immunochemical test   EXAM:  CT ABDOMEN AND PELVIS WITH CONTRAST   TECHNIQUE:  Multidetector CT imaging of the abdomen and pelvis was performed  using the standard protocol following bolus administration of  intravenous contrast.   RADIATION DOSE REDUCTION: This exam was performed according to the  departmental dose-optimization program which includes automated  exposure control, adjustment of the mA and/or kV according to  patient size and/or use of iterative reconstruction technique.   CONTRAST:  OMNIPAQUE  IOHEXOL  300 MG/ML  SOLN   COMPARISON:  MRI 06/04/2021   FINDINGS:  Lower chest: Lung bases demonstrate no acute airspace disease.  Gynecomastia   Hepatobiliary: No focal liver abnormality is seen. No gallstones,  gallbladder wall thickening, or biliary dilatation.   Pancreas: Unremarkable. No pancreatic ductal dilatation or  surrounding inflammatory changes.   Spleen: Normal in size without focal abnormality.   Adrenals/Urinary Tract: Adrenal glands are normal. Kidneys show no  hydronephrosis. Cyst in the lower pole left kidney, no imaging  follow-up is recommended. Bladder is unremarkable   Stomach/Bowel: Stomach within normal limits.  No dilated small bowel.  No acute bowel wall thickening. Negative appendix. Diverticular  disease of the colon.    Vascular/Lymphatic: Aortic atherosclerosis. No enlarged abdominal or  pelvic lymph nodes.   Reproductive: Enlarged prostate   Other: No ascites or free air. Right greater than left fat  containing inguinal hernias. Small fat containing umbilical hernia   Musculoskeletal: No acute osseous abnormality   IMPRESSION:  1. No CT evidence for acute intra-abdominal or pelvic abnormality.  2. Diverticular disease of the colon without acute inflammatory  process.  3. Enlarged prostate.  4. Aortic atherosclerosis.   Aortic Atherosclerosis (ICD10-I70.0).    Electronically Signed    By: Luke Bun M.D.    On: 04/13/2024 01:15   Assessment:      Positive FIT (fecal immunochemical test) [R19.5] Bilateral inguinal hernia Umbilical hernia  Plan:     1. Positive FIT (fecal immunochemical test) [R19.5] R/b/a discussed.  Risks include bleeding, perforation.  Benefits include diagnostic, curative procedure if needed.  Alternatives include continued observation.  Pt verbalized understanding. Will schedule  Bilateral inguinal and umbilical hernia- can consider repair after colonoscopy.  Pt requested to discuss in detail after colonoscopy  labs/images/medications/previous chart entries reviewed personally and relevant changes/updates noted above.

## 2024-05-09 ENCOUNTER — Encounter: Payer: Self-pay | Admitting: Surgery

## 2024-05-09 ENCOUNTER — Ambulatory Visit: Admission: RE | Admit: 2024-05-09 | Source: Home / Self Care | Admitting: Surgery

## 2024-05-09 ENCOUNTER — Ambulatory Visit: Admitting: Certified Registered"

## 2024-05-09 ENCOUNTER — Encounter
Admission: RE | Disposition: A | Discharge status: Home / Self Care | Payer: Self-pay | Source: Home / Self Care | Attending: Surgery

## 2024-05-09 MED ORDER — PROPOFOL 500 MG/50ML IV EMUL
INTRAVENOUS | Status: DC | PRN
  Administered 2024-05-09: 125 ug/kg/min via INTRAVENOUS

## 2024-05-09 MED ORDER — PHENYLEPHRINE 80 MCG/ML (10ML) SYRINGE FOR IV PUSH (FOR BLOOD PRESSURE SUPPORT)
PREFILLED_SYRINGE | INTRAVENOUS | Status: DC | PRN
  Administered 2024-05-09: 80 ug via INTRAVENOUS

## 2024-05-09 MED ORDER — PROPOFOL 1000 MG/100ML IV EMUL
INTRAVENOUS | Status: AC
  Filled 2024-05-09: qty 100

## 2024-05-09 MED ORDER — LIDOCAINE HCL (CARDIAC) PF 100 MG/5ML IV SOSY
PREFILLED_SYRINGE | INTRAVENOUS | Status: DC | PRN
  Administered 2024-05-09: 50 mg via INTRAVENOUS

## 2024-05-09 MED ORDER — SODIUM CHLORIDE 0.9 % IV SOLN: INTRAVENOUS | Status: DC

## 2024-05-09 MED ORDER — PROPOFOL 10 MG/ML IV BOLUS
INTRAVENOUS | Status: DC | PRN
  Administered 2024-05-09: 80 mg via INTRAVENOUS
  Administered 2024-05-09 (×2): 30 mg via INTRAVENOUS
  Administered 2024-05-09: 60 mg via INTRAVENOUS

## 2024-05-09 MED ORDER — LIDOCAINE HCL URETHRAL/MUCOSAL 2 % EX GEL
CUTANEOUS | Status: AC
  Filled 2024-05-09: qty 5

## 2024-05-09 NOTE — Op Note (Signed)
 [facility] Patient Name: Peter Whitaker Procedure Date: 05/09/2024 8:21 AM MRN: [id number] Account #: [id number] Date of Birth: 08/18/1955 Admit Type: Outpatient Age: 69 Room: [facility] ENDO ROOM 4 Gender: Male Note Status: Finalized Instrument Name: Colon Scope [id number] Procedure:             Colonoscopy Indications:           Screening for colorectal malignant neoplasm due to                         positive fecal immunochemical test Providers:             Henriette Pierre MD, MD Referring MD:          Alm Needle (Referring MD) Medicines:             Propofol  per Anesthesia Complications:         No immediate complications. Procedure:             Pre-Anesthesia Assessment:                        - After reviewing the risks and benefits, the patient                         was deemed in satisfactory condition to undergo the                         procedure in an ambulatory setting.                        After obtaining informed consent, the colonoscope was                         passed under direct vision. Throughout the procedure,                         the patient's blood pressure, pulse, and oxygen                         saturations were monitored continuously. The                         Colonoscope was introduced through the anus and                         advanced to the the cecum, identified by the ileocecal                         valve. The colonoscopy was performed without                         difficulty. The patient tolerated the procedure well.                         The quality of the bowel preparation was adequate. Findings:      Skin tags were found on perianal exam.      Two sessile polyps were found in the anus and distal ascending colon.       The polyps were 3 to 5 mm in size. These polyps were removed with a  hot       snare. Resection and retrieval were complete. Estimated blood loss was       minimal.      For  hemostasis, three hemostatic clips were successfully placed at the       anus and in the distal ascending colon. There was no bleeding during the       procedure.      Many large-mouthed diverticula were found in the sigmoid colon. Impression:            - Perianal skin tags found on perianal exam.                        - Two 3 to 5 mm polyps at the anus and in the distal                         ascending colon, removed with a hot snare. Resected                         and retrieved.                        - Diverticulosis in the sigmoid colon.                        - Three hemostatic clips were successfully placed at                         the anus and in the distal ascending colon. Recommendation:        - Discharge patient to home.                        - Resume previous diet.                        - Await pathology results. Procedure Code(s):     --- Professional ---                        [phone], 59, Colonoscopy, flexible; with control of                         bleeding, any method                        [id number], Colonoscopy, flexible; with removal of                         tumor(s), polyp(s), or other lesion(s) by snare                         technique Diagnosis Code(s):     --- Professional ---                        Z12.11, Encounter for screening for malignant neoplasm                         of colon                        R19.5, Other fecal abnormalities  K62.0, Anal polyp                        D12.2, Benign neoplasm of ascending colon                        K64.4, Residual hemorrhoidal skin tags                        K57.30, Diverticulosis of large intestine without                         perforation or abscess without bleeding CPT copyright 2022 American Medical Association. All rights reserved. The codes documented in this report are preliminary and upon coder review may  be revised to meet current compliance requirements. Dr. Henriette Sevin,  MD Henriette Pierre MD, MD 05/09/2024 9:30:41 AM This report has been signed electronically. Number of Addenda: 0 Note Initiated On: 05/09/2024 8:21 AM Scope Withdrawal Time: 0 hours 26 minutes 12 seconds  Total Procedure Duration: 0 hours 31 minutes 16 seconds  Estimated Blood Loss:  Estimated blood loss was minimal.      [facility]

## 2024-05-09 NOTE — Interval H&P Note (Signed)
 History and Physical Interval Note:  05/09/2024 10:05 AM  Peter Whitaker  has presented today for surgery, with the diagnosis of Screening colonoscopy.  The various methods of treatment have been discussed with the patient and family. After consideration of risks, benefits and other options for treatment, the patient has consented to  Procedures: COLONOSCOPY (N/A) COLONOSCOPY, WITH POLYPECTOMY CONTROL OF HEMORRHAGE, GI TRACT, ENDOSCOPIC, BY CLIPPING OR OVERSEWING as a surgical intervention.  The patient's history has been reviewed, patient examined, no change in status, stable for surgery.  I have reviewed the patient's chart and labs.  Questions were answered to the patient's satisfaction.     [name]

## 2024-05-09 NOTE — Anesthesia Preprocedure Evaluation (Signed)
"                                    Anesthesia Evaluation  Patient identified by MRN, date of birth, ID band Patient awake    Reviewed: Allergy & Precautions, H&P , NPO status , Patient's Chart, lab work & pertinent test results, reviewed documented beta blocker date and time   Airway Mallampati: II   Neck ROM: full    Dental  (+) Poor Dentition   Pulmonary asthma , former smoker   Pulmonary exam normal        Cardiovascular Exercise Tolerance: Poor hypertension, On Medications negative cardio ROS Normal cardiovascular exam Rhythm:regular Rate:Normal     Neuro/Psych negative neurological ROS  negative psych ROS   GI/Hepatic negative GI ROS, Neg liver ROS,,,  Endo/Other  negative endocrine ROS    Renal/GU negative Renal ROS  negative genitourinary   Musculoskeletal   Abdominal   Peds  Hematology  (+) Blood dyscrasia, anemia   Anesthesia Other Findings Past Medical History: No date: Arthritis No date: Asthma No date: Elevated PSA No date: Glaucoma No date: HLD (hyperlipidemia) No date: HTN (hypertension) History reviewed. No pertinent surgical history. BMI    Body Mass Index: 27.55 kg/m     Reproductive/Obstetrics negative OB ROS                              Anesthesia Physical Anesthesia Plan  ASA: 3  Anesthesia Plan: General   Post-op Pain Management:    Induction:   PONV Risk Score and Plan:   Airway Management Planned:   Additional Equipment:   Intra-op Plan:   Post-operative Plan:   Informed Consent: I have reviewed the patients History and Physical, chart, labs and discussed the procedure including the risks, benefits and alternatives for the proposed anesthesia with the patient or authorized representative who has indicated his/her understanding and acceptance.     Dental Advisory Given  Plan Discussed with: CRNA  Anesthesia Plan Comments:         Anesthesia Quick Evaluation  "

## 2024-05-09 NOTE — Anesthesia Postprocedure Evaluation (Signed)
"   Anesthesia Post Note  Patient: Peter Whitaker  Procedure(s) Performed: COLONOSCOPY COLONOSCOPY, WITH POLYPECTOMY CONTROL OF HEMORRHAGE, GI TRACT, ENDOSCOPIC, BY CLIPPING OR OVERSEWING  Patient location during evaluation: PACU Anesthesia Type: General Level of consciousness: awake and alert Pain management: pain level controlled Vital Signs Assessment: post-procedure vital signs reviewed and stable Respiratory status: spontaneous breathing, nonlabored ventilation, respiratory function stable and patient connected to nasal cannula oxygen Cardiovascular status: blood pressure returned to baseline and stable Postop Assessment: no apparent nausea or vomiting Anesthetic complications: no   No notable events documented.   Last Vitals:  Vitals:   05/09/24 0926 05/09/24 0936  BP: (!) 111/52 129/69  Pulse:  65  Resp:  20  Temp:    SpO2: 99% 100%    Last Pain:  Vitals:   05/09/24 0936  TempSrc:   PainSc: 0-No pain                 Lynwood KANDICE Clause      "

## 2024-05-09 NOTE — Transfer of Care (Signed)
 Immediate Anesthesia Transfer of Care Note  Patient: Peter Whitaker  Procedure(s) Performed: COLONOSCOPY COLONOSCOPY, WITH POLYPECTOMY CONTROL OF HEMORRHAGE, GI TRACT, ENDOSCOPIC, BY CLIPPING OR OVERSEWING  Patient Location: PACU  Anesthesia Type:MAC  Level of Consciousness: awake, alert , and oriented  Airway & Oxygen Therapy: Patient Spontanous Breathing  Post-op Assessment: Report given to RN and Post -op Vital signs reviewed and stable  Post vital signs: stable  Last Vitals:  Vitals Value Taken Time  BP 111/52 05/09/24 09:26  Temp    Pulse 81 05/09/24 09:28  Resp 17 05/09/24 09:28  SpO2 100 % 05/09/24 09:28  Vitals shown include unfiled device data.  Last Pain:  Vitals:   05/09/24 0926  TempSrc:   PainSc: 0-No pain         Complications: No notable events documented.

## 2024-05-10 LAB — SURGICAL PATHOLOGY

## 2024-06-11 ENCOUNTER — Ambulatory Visit: Admitting: Urology

## 2024-10-15 ENCOUNTER — Inpatient Hospital Stay

## 2025-04-15 ENCOUNTER — Inpatient Hospital Stay: Admitting: Oncology

## 2025-04-15 ENCOUNTER — Inpatient Hospital Stay
